# Patient Record
Sex: Female | Born: 1975 | Race: Black or African American | Hispanic: No | Marital: Single | Smoking: Current every day smoker
Health system: Southern US, Community
[De-identification: ages and names within clinical notes are randomized; demographics above are authoritative.]

## PROBLEM LIST (undated history)

## (undated) DIAGNOSIS — B977 Papillomavirus as the cause of diseases classified elsewhere: Secondary | ICD-10-CM

## (undated) DIAGNOSIS — N63 Unspecified lump in unspecified breast: Secondary | ICD-10-CM

## (undated) DIAGNOSIS — R569 Unspecified convulsions: Secondary | ICD-10-CM

## (undated) DIAGNOSIS — R011 Cardiac murmur, unspecified: Secondary | ICD-10-CM

## (undated) DIAGNOSIS — R51 Headache: Secondary | ICD-10-CM

## (undated) DIAGNOSIS — D649 Anemia, unspecified: Secondary | ICD-10-CM

## (undated) HISTORY — DX: Cardiac murmur, unspecified: R01.1

## (undated) HISTORY — PX: OTHER SURGICAL HISTORY: SHX169

## (undated) HISTORY — DX: Anemia, unspecified: D64.9

## (undated) HISTORY — DX: Unspecified lump in unspecified breast: N63.0

---

## 1997-08-09 HISTORY — PX: INGUINAL HERNIA REPAIR: SUR1180

## 1997-09-27 ENCOUNTER — Inpatient Hospital Stay (HOSPITAL_COMMUNITY): Admission: AD | Admit: 1997-09-27 | Discharge: 1997-09-27 | Payer: Self-pay | Admitting: Obstetrics

## 1998-04-01 ENCOUNTER — Ambulatory Visit (HOSPITAL_COMMUNITY): Admission: RE | Admit: 1998-04-01 | Discharge: 1998-04-01 | Payer: Self-pay | Admitting: Obstetrics

## 1998-04-01 ENCOUNTER — Other Ambulatory Visit: Admission: RE | Admit: 1998-04-01 | Discharge: 1998-04-01 | Payer: Self-pay | Admitting: Obstetrics

## 1998-04-16 ENCOUNTER — Ambulatory Visit (HOSPITAL_COMMUNITY): Admission: RE | Admit: 1998-04-16 | Discharge: 1998-04-16 | Payer: Self-pay | Admitting: General Surgery

## 1998-04-30 ENCOUNTER — Ambulatory Visit (HOSPITAL_COMMUNITY): Admission: RE | Admit: 1998-04-30 | Discharge: 1998-04-30 | Payer: Self-pay | Admitting: General Surgery

## 1999-05-24 ENCOUNTER — Inpatient Hospital Stay (HOSPITAL_COMMUNITY): Admission: AD | Admit: 1999-05-24 | Discharge: 1999-05-24 | Payer: Self-pay | Admitting: Obstetrics

## 1999-05-27 ENCOUNTER — Other Ambulatory Visit: Admission: RE | Admit: 1999-05-27 | Discharge: 1999-05-27 | Payer: Self-pay | Admitting: Obstetrics

## 1999-06-10 ENCOUNTER — Emergency Department (HOSPITAL_COMMUNITY): Admission: EM | Admit: 1999-06-10 | Discharge: 1999-06-10 | Payer: Self-pay | Admitting: Emergency Medicine

## 1999-06-10 ENCOUNTER — Encounter: Payer: Self-pay | Admitting: Emergency Medicine

## 1999-08-10 ENCOUNTER — Inpatient Hospital Stay (HOSPITAL_COMMUNITY): Admission: AD | Admit: 1999-08-10 | Discharge: 1999-08-10 | Payer: Self-pay | Admitting: Obstetrics

## 1999-08-17 ENCOUNTER — Inpatient Hospital Stay (HOSPITAL_COMMUNITY): Admission: AD | Admit: 1999-08-17 | Discharge: 1999-08-17 | Payer: Self-pay | Admitting: Obstetrics

## 1999-09-04 ENCOUNTER — Inpatient Hospital Stay (HOSPITAL_COMMUNITY): Admission: AD | Admit: 1999-09-04 | Discharge: 1999-09-04 | Payer: Self-pay | Admitting: Obstetrics

## 1999-09-19 ENCOUNTER — Inpatient Hospital Stay (HOSPITAL_COMMUNITY): Admission: AD | Admit: 1999-09-19 | Discharge: 1999-09-19 | Payer: Self-pay | Admitting: Obstetrics

## 1999-09-21 ENCOUNTER — Other Ambulatory Visit: Admission: RE | Admit: 1999-09-21 | Discharge: 1999-09-21 | Payer: Self-pay | Admitting: Obstetrics

## 1999-12-22 ENCOUNTER — Inpatient Hospital Stay (HOSPITAL_COMMUNITY): Admission: AD | Admit: 1999-12-22 | Discharge: 1999-12-22 | Payer: Self-pay | Admitting: *Deleted

## 1999-12-25 ENCOUNTER — Encounter (HOSPITAL_COMMUNITY): Admission: AD | Admit: 1999-12-25 | Discharge: 2000-01-01 | Payer: Self-pay | Admitting: Obstetrics

## 1999-12-31 ENCOUNTER — Inpatient Hospital Stay (HOSPITAL_COMMUNITY): Admission: AD | Admit: 1999-12-31 | Discharge: 2000-01-03 | Payer: Self-pay | Admitting: Obstetrics

## 2000-05-24 ENCOUNTER — Other Ambulatory Visit: Admission: RE | Admit: 2000-05-24 | Discharge: 2000-05-24 | Payer: Self-pay | Admitting: Obstetrics

## 2000-09-20 ENCOUNTER — Other Ambulatory Visit: Admission: RE | Admit: 2000-09-20 | Discharge: 2000-09-20 | Payer: Self-pay | Admitting: Obstetrics

## 2001-08-07 ENCOUNTER — Inpatient Hospital Stay (HOSPITAL_COMMUNITY): Admission: AD | Admit: 2001-08-07 | Discharge: 2001-08-07 | Payer: Self-pay | Admitting: Obstetrics

## 2001-11-13 ENCOUNTER — Encounter: Admission: RE | Admit: 2001-11-13 | Discharge: 2001-11-13 | Payer: Self-pay | Admitting: General Surgery

## 2001-11-13 ENCOUNTER — Encounter (HOSPITAL_BASED_OUTPATIENT_CLINIC_OR_DEPARTMENT_OTHER): Payer: Self-pay | Admitting: General Surgery

## 2002-04-13 ENCOUNTER — Inpatient Hospital Stay (HOSPITAL_COMMUNITY): Admission: AD | Admit: 2002-04-13 | Discharge: 2002-04-13 | Payer: Self-pay | Admitting: *Deleted

## 2002-11-05 ENCOUNTER — Emergency Department (HOSPITAL_COMMUNITY): Admission: EM | Admit: 2002-11-05 | Discharge: 2002-11-05 | Payer: Self-pay | Admitting: Emergency Medicine

## 2002-11-06 ENCOUNTER — Emergency Department (HOSPITAL_COMMUNITY): Admission: EM | Admit: 2002-11-06 | Discharge: 2002-11-06 | Payer: Self-pay | Admitting: Emergency Medicine

## 2002-11-08 ENCOUNTER — Inpatient Hospital Stay (HOSPITAL_COMMUNITY): Admission: AD | Admit: 2002-11-08 | Discharge: 2002-11-08 | Payer: Self-pay | Admitting: *Deleted

## 2003-04-22 ENCOUNTER — Inpatient Hospital Stay (HOSPITAL_COMMUNITY): Admission: AD | Admit: 2003-04-22 | Discharge: 2003-04-22 | Payer: Self-pay | Admitting: Obstetrics & Gynecology

## 2003-11-01 ENCOUNTER — Inpatient Hospital Stay (HOSPITAL_COMMUNITY): Admission: AD | Admit: 2003-11-01 | Discharge: 2003-11-01 | Payer: Self-pay | Admitting: Obstetrics & Gynecology

## 2004-01-07 ENCOUNTER — Inpatient Hospital Stay (HOSPITAL_COMMUNITY): Admission: AD | Admit: 2004-01-07 | Discharge: 2004-01-07 | Payer: Self-pay | Admitting: Obstetrics

## 2004-02-04 ENCOUNTER — Ambulatory Visit (HOSPITAL_COMMUNITY): Admission: RE | Admit: 2004-02-04 | Discharge: 2004-02-04 | Payer: Self-pay | Admitting: Obstetrics & Gynecology

## 2004-04-06 ENCOUNTER — Inpatient Hospital Stay (HOSPITAL_COMMUNITY): Admission: AD | Admit: 2004-04-06 | Discharge: 2004-04-06 | Payer: Self-pay | Admitting: Obstetrics

## 2004-06-16 ENCOUNTER — Ambulatory Visit (HOSPITAL_COMMUNITY): Admission: RE | Admit: 2004-06-16 | Discharge: 2004-06-16 | Payer: Self-pay | Admitting: Obstetrics & Gynecology

## 2004-06-22 ENCOUNTER — Inpatient Hospital Stay (HOSPITAL_COMMUNITY): Admission: AD | Admit: 2004-06-22 | Discharge: 2004-06-22 | Payer: Self-pay | Admitting: Obstetrics

## 2004-07-08 ENCOUNTER — Observation Stay (HOSPITAL_COMMUNITY): Admission: RE | Admit: 2004-07-08 | Discharge: 2004-07-09 | Payer: Self-pay | Admitting: Obstetrics & Gynecology

## 2004-07-16 ENCOUNTER — Inpatient Hospital Stay (HOSPITAL_COMMUNITY): Admission: AD | Admit: 2004-07-16 | Discharge: 2004-07-18 | Payer: Self-pay | Admitting: Obstetrics

## 2005-01-04 ENCOUNTER — Inpatient Hospital Stay (HOSPITAL_COMMUNITY): Admission: AD | Admit: 2005-01-04 | Discharge: 2005-01-04 | Payer: Self-pay | Admitting: Obstetrics

## 2005-05-08 IMAGING — US US FETAL BPP W/O NONSTRESS
1 series · 18 of 19 positions shown · non-contrast
Comparison: none

CLINICAL DATA: 40 week gestation.  Assess fetal well-being.

[Series 1: us fetal bpp w/o nonstress · non-contrast · 18 of 19 slices shown]
[im 1/19]
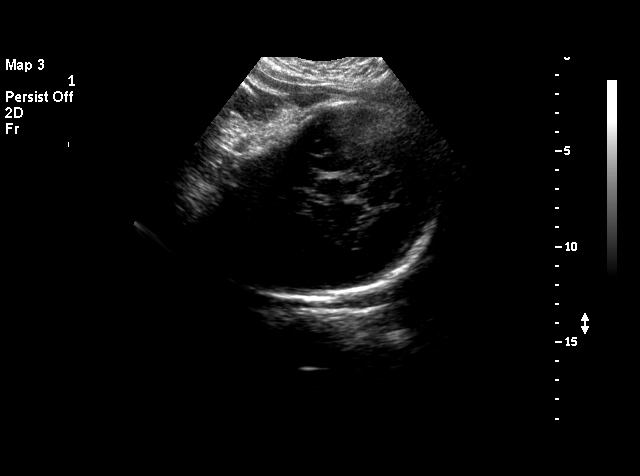
[im 2/19]
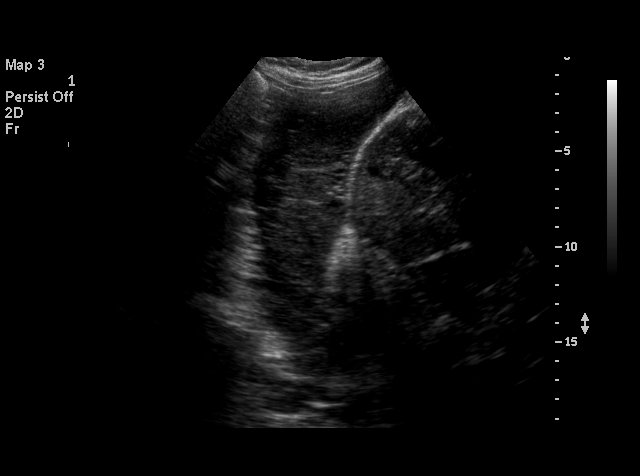
[im 3/19]
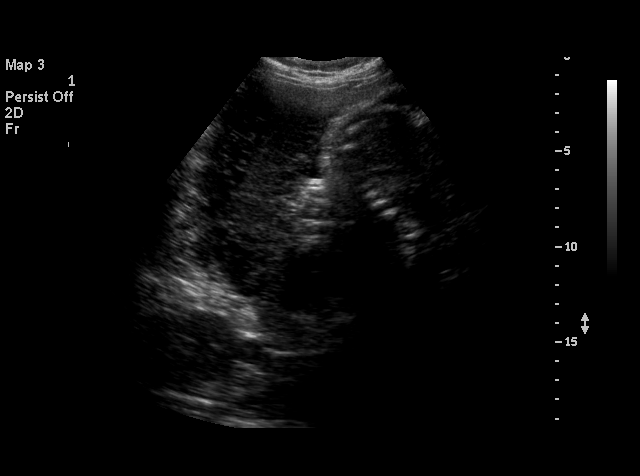
[im 4/19]
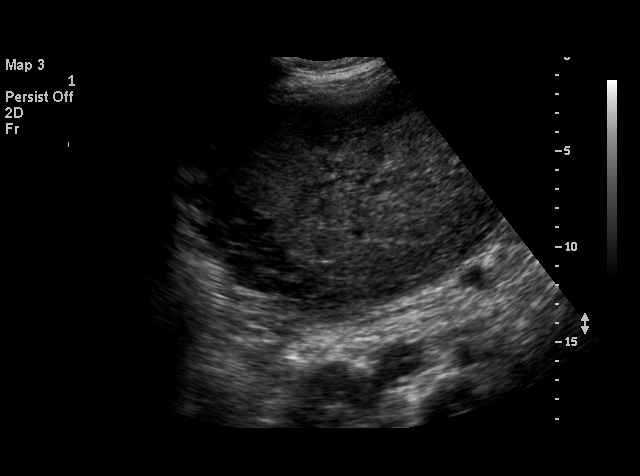
[im 5/19]
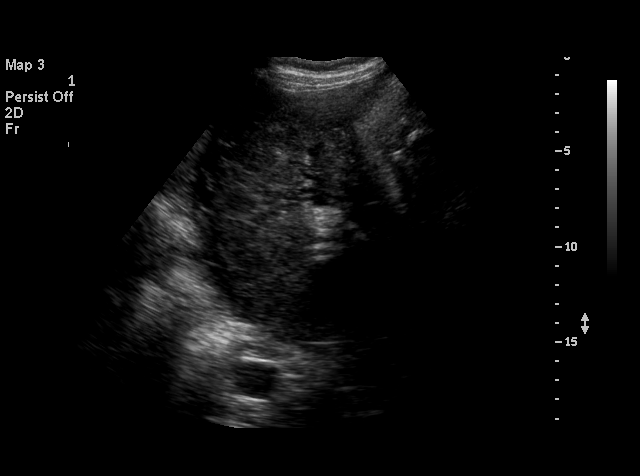
[im 6/19]
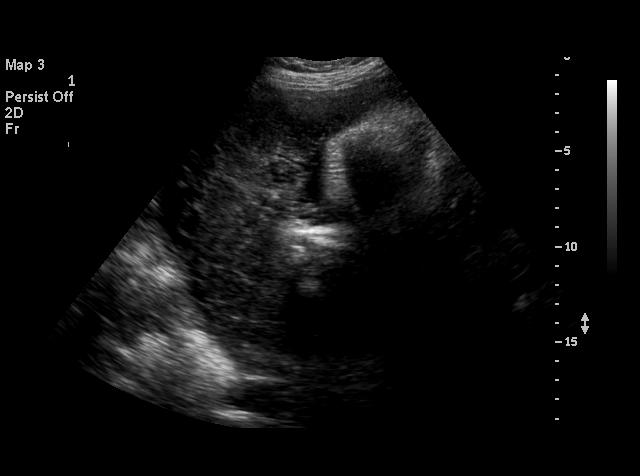
[im 7/19]
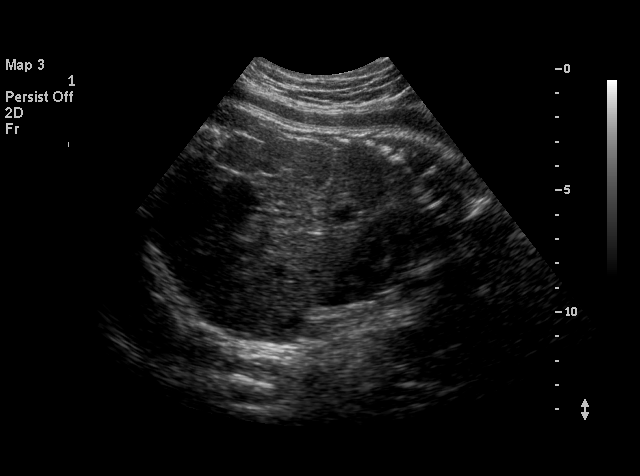
[im 8/19]
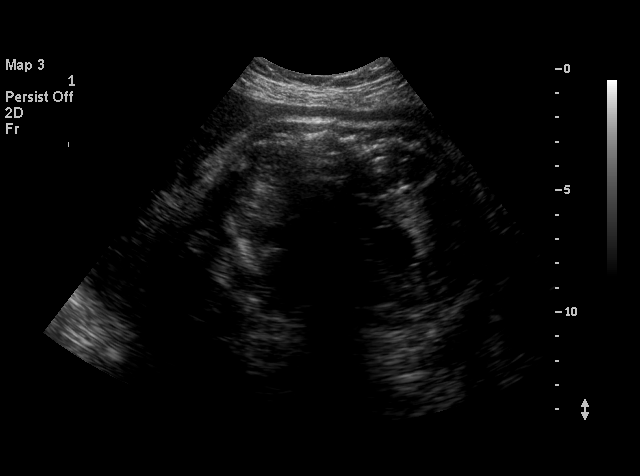
[im 9/19]
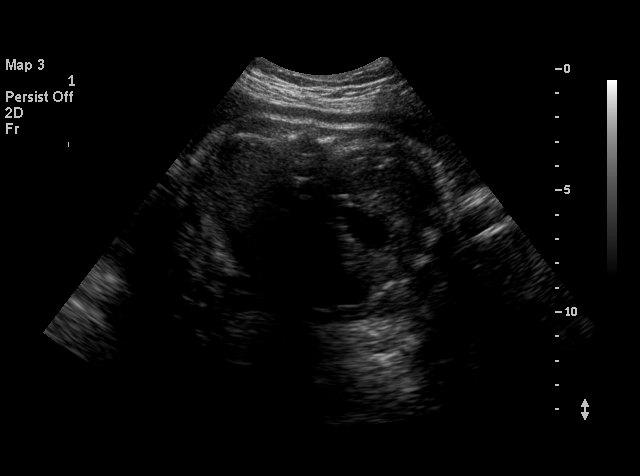
[im 11/19]
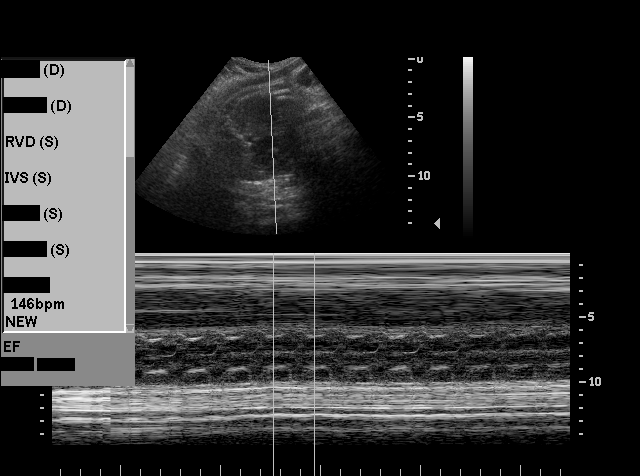
[im 12/19]
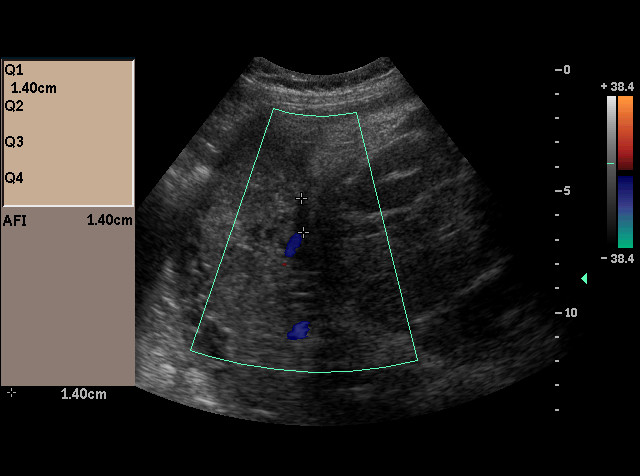
[im 13/19]
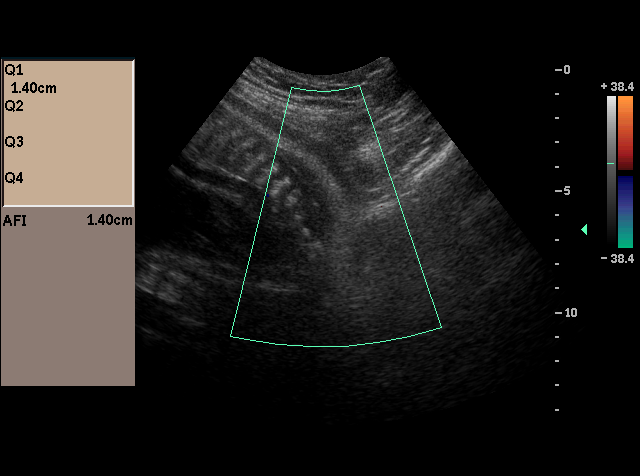
[im 14/19]
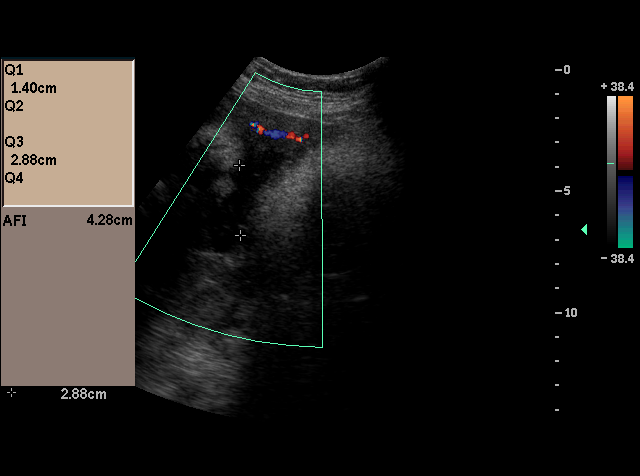
[im 15/19]
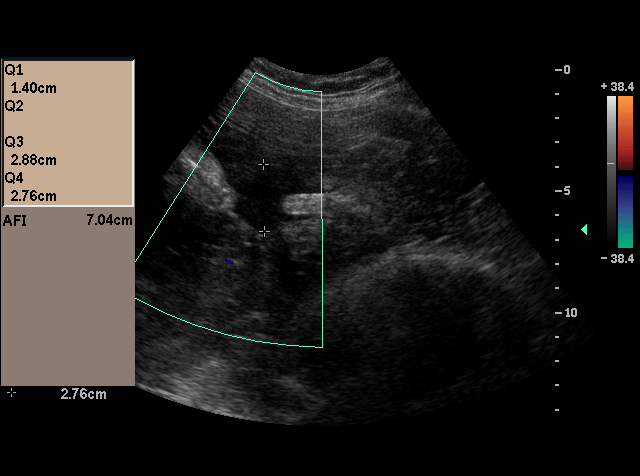
[im 16/19]
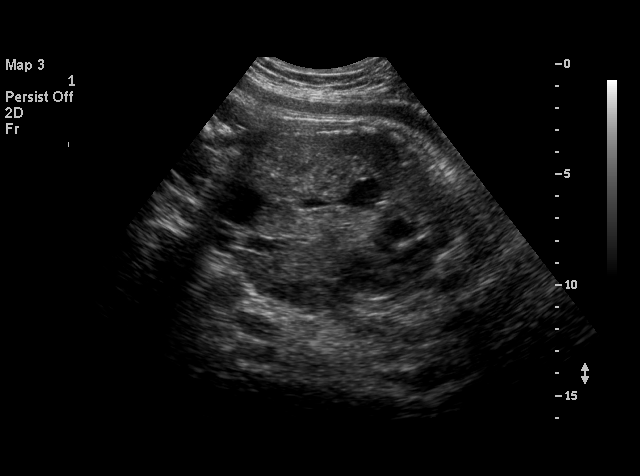
[im 17/19]
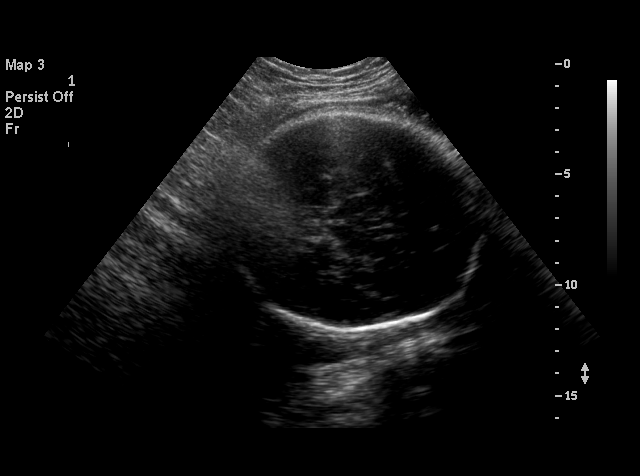
[im 18/19]
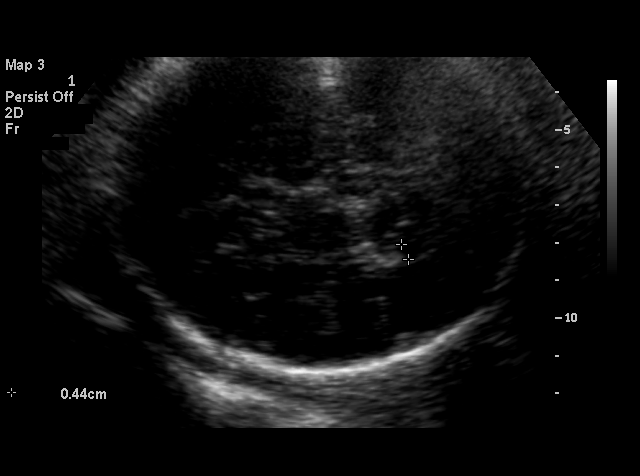
[im 19/19]
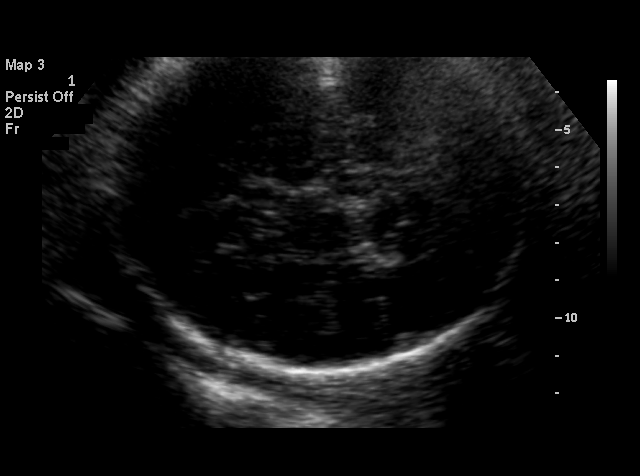

[18 of 19 positions shown; findings below may reference images not displayed]

LIMITED OBSTETRICAL ULTRASOUND:
 Number of Fetuses:  1
 Heart Rate:  146
 Movement:  Yes
 Breathing:  Yes
 Presentation:  Cephalic
 Placental Location:  Fundal, posterior
 Grade:  III
 Previa:  No
 Amniotic Fluid (Subjective):  Low normal to decreased
 Amniotic Fluid (Objective):  7.0 cm AFI (5th -95th%ile = 7.1 ? 21.4 cm for 40 wks)

 Fetal measurements and complete anatomic evaluation were not requested.  The following fetal anatomy was visualized during this exam: Lateral ventricles, thalami, stomach, kidneys and bladder.  

 MATERNAL UTERINE AND ADNEXAL FINDINGS
 Cervix:  Not evaluated.

 BIOPHYSICAL PROFILE

 Movement:  2      Time:  15 minutes
 Breathing:  2
 Tone:  2
 Amniotic Fluid:  2

 Total Score:  8
IMPRESSION: Single living intrauterine fetus in cephalic presentation with subjectively low normal to decreased amniotic fluid volume.  The AFI is 7 cm which falls at the 5th percentile for a 40 week gestation.  
 Assessment of fetal anatomy is limited by the advanced gestational age.
 Biophysical profile score is [DATE] after 15 minutes of observation.

## 2005-05-09 IMAGING — US US OB LIMITED
1 series · 14 of 25 positions shown · non-contrast
Comparison: none

CLINICAL DATA: Low fluid on office ultrasound.

[Series 1: us ob limited · 0.35mm/px · 14 of 25 slices shown]
[im 1/25]
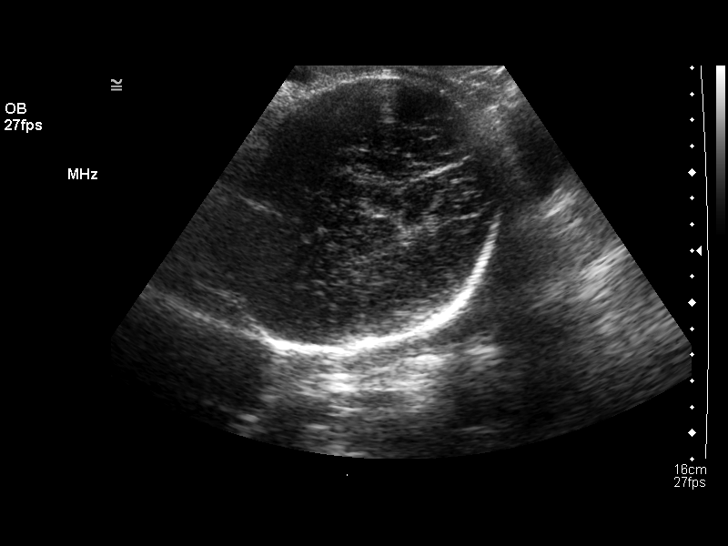
[im 3/25]
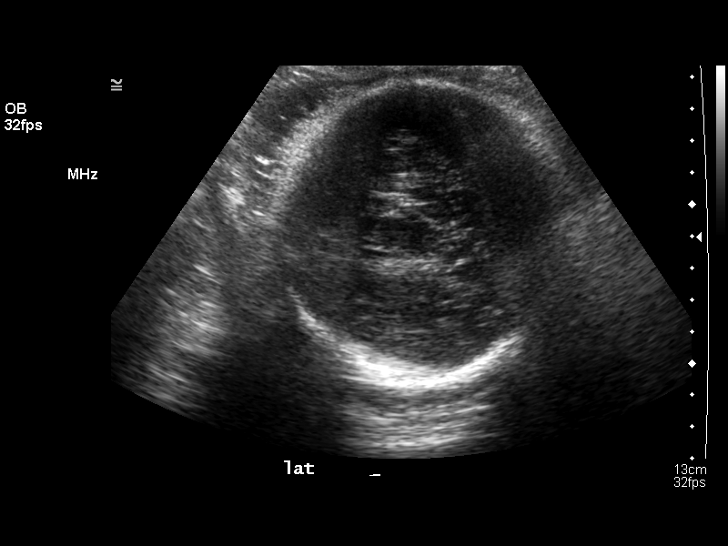
[im 5/25]
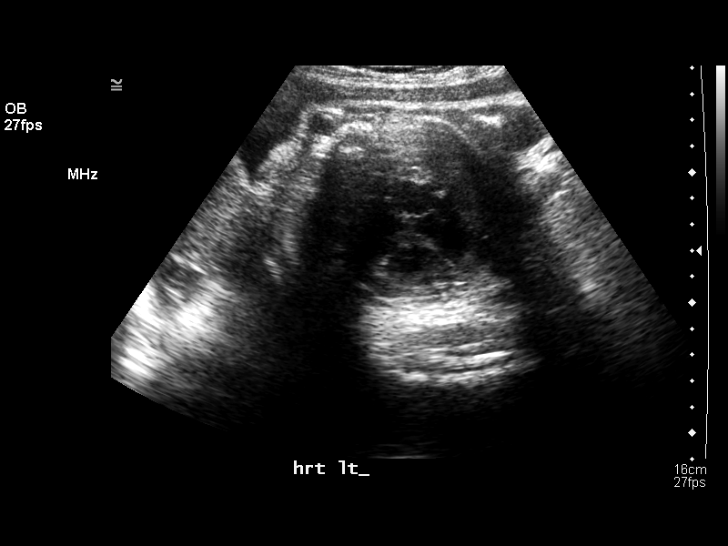
[im 7/25]
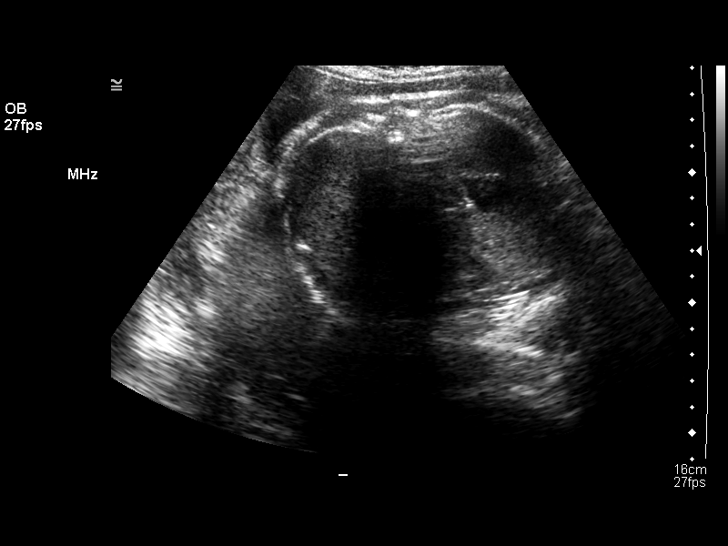
[im 9/25]
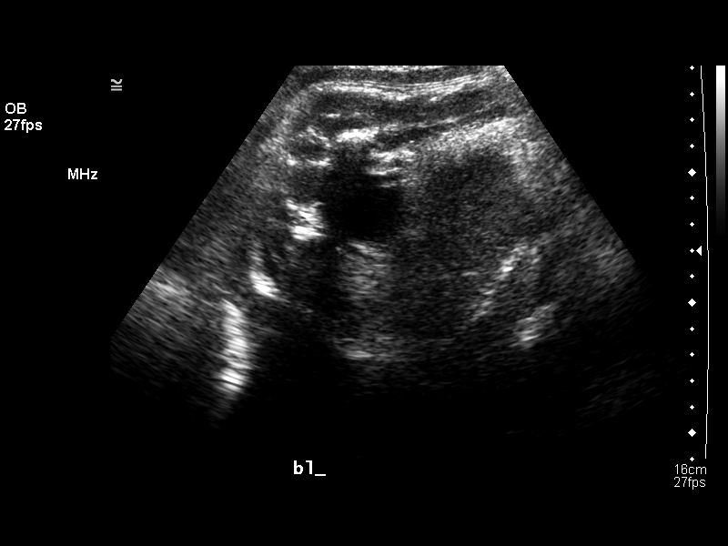
[im 10/25]
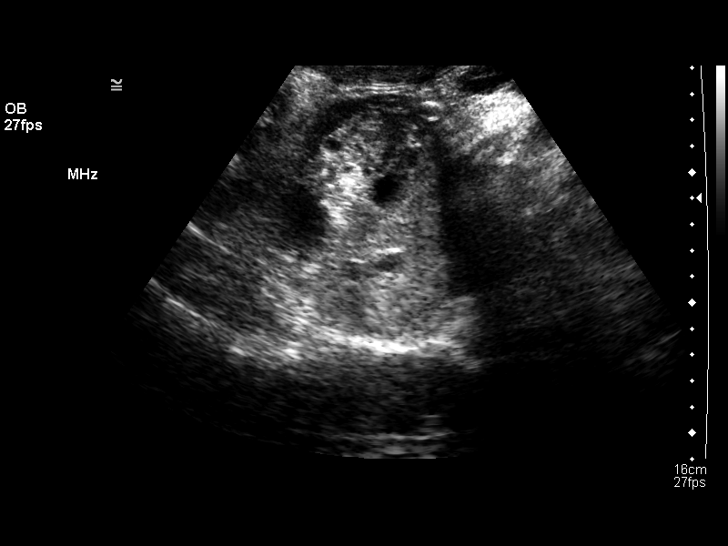
[im 12/25]
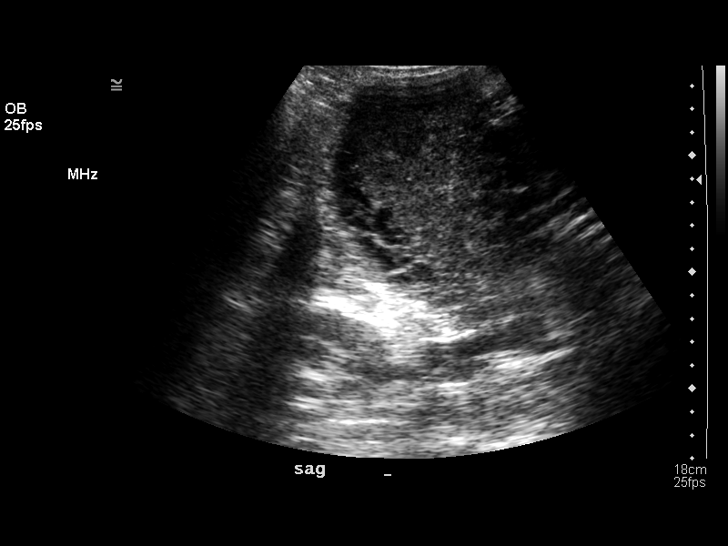
[im 14/25]
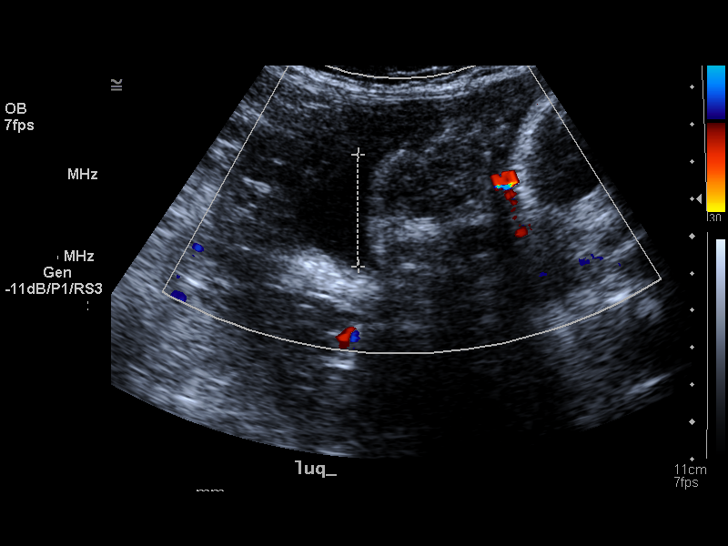
[im 16/25]
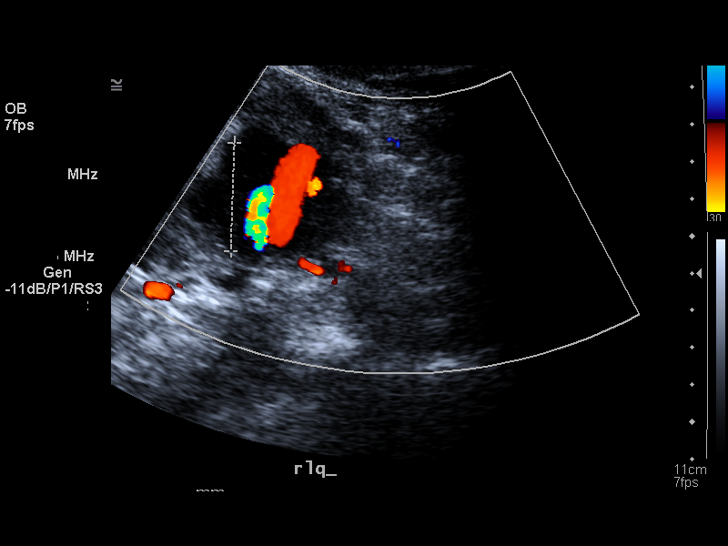
[im 17/25]
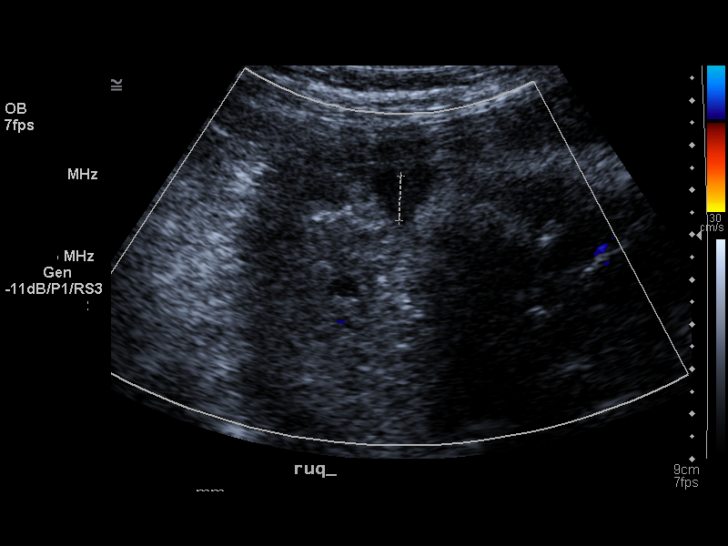
[im 19/25]
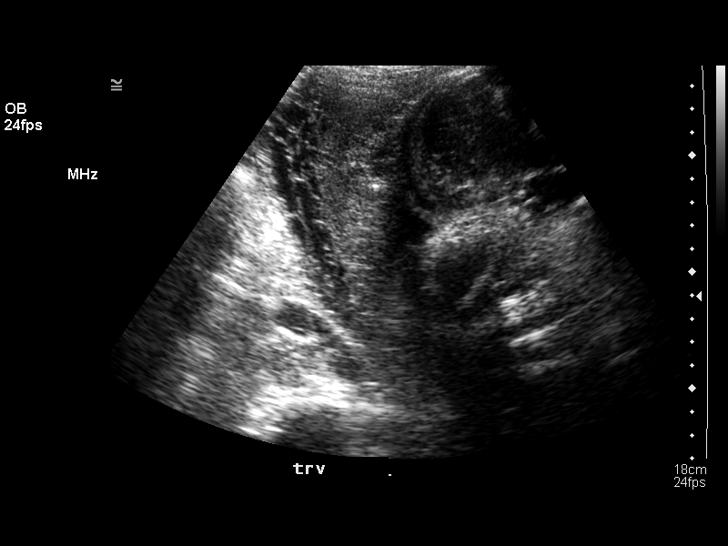
[im 21/25]
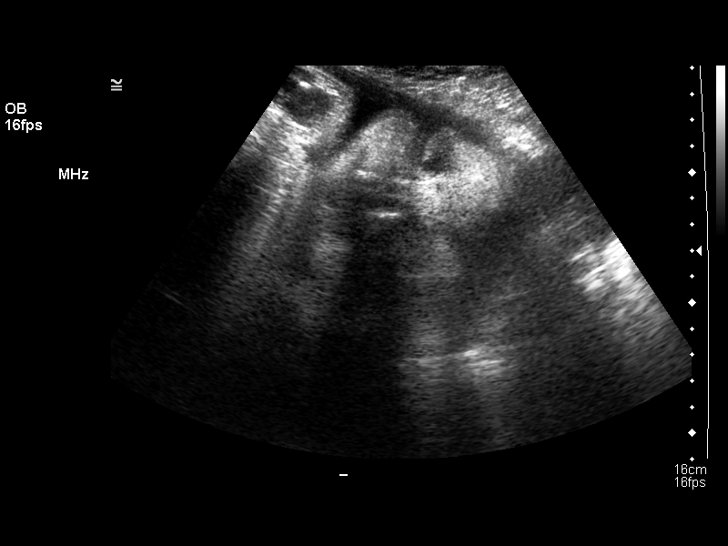
[im 23/25]
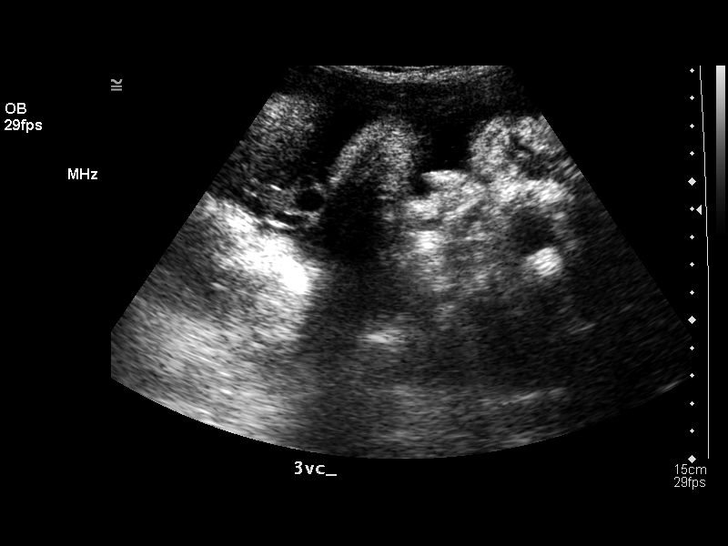
[im 25/25]
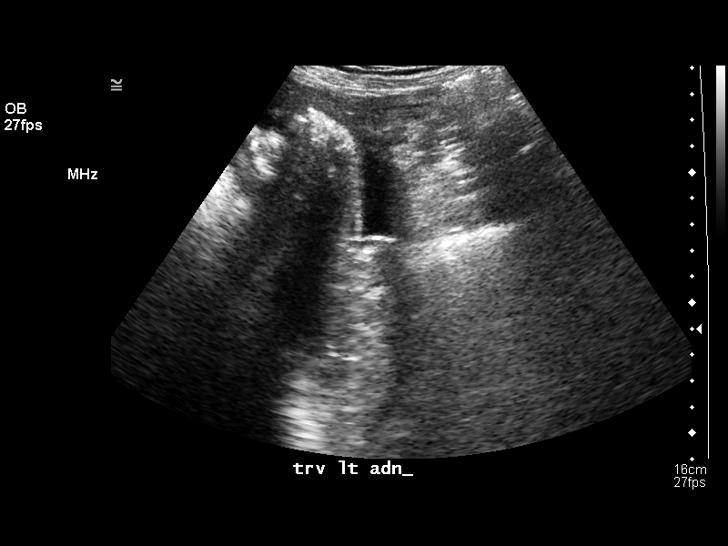

[14 of 25 positions shown; findings below may reference images not displayed]

LIMITED OBSTETRICAL ULTRASOUND:
Number of Fetuses:  1
Heart Rate:  144
Movement:  Yes
Breathing:  Yes
Presentation:  Cephalic
Placental Location:  Fundal, posterior
Grade:  II
Previa:  No
Amniotic Fluid (Subjective):  Normal
Amniotic Fluid (Objective):  11.1 cm AFI (5th -95th%ile = 7.1 ? 21.4 cm for 40 wks)

Fetal measurements and complete anatomic evaluation were not requested.  The following fetal anatomy was visualized during this exam:  Lateral ventricles, stomach, 3-vessel cord, kidneys and bladder.  

MATERNAL UTERINE AND ADNEXAL FINDINGS
Cervix:  Not evaluated
IMPRESSION: 1.  Subjectively and quantitatively normal amniotic fluid volume.
2.  No late developing fetal anatomic abnormalities are identified associated with the lateral ventricles, four  chamber heart, stomach, kidneys or bladder.

## 2005-07-23 ENCOUNTER — Other Ambulatory Visit: Admission: RE | Admit: 2005-07-23 | Discharge: 2005-07-23 | Payer: Self-pay | Admitting: Obstetrics and Gynecology

## 2005-11-03 ENCOUNTER — Ambulatory Visit (HOSPITAL_COMMUNITY): Admission: RE | Admit: 2005-11-03 | Discharge: 2005-11-03 | Payer: Self-pay | Admitting: Obstetrics and Gynecology

## 2005-11-10 ENCOUNTER — Ambulatory Visit (HOSPITAL_COMMUNITY): Admission: RE | Admit: 2005-11-10 | Discharge: 2005-11-10 | Payer: Self-pay | Admitting: Obstetrics and Gynecology

## 2005-11-12 ENCOUNTER — Inpatient Hospital Stay (HOSPITAL_COMMUNITY): Admission: AD | Admit: 2005-11-12 | Discharge: 2005-11-16 | Payer: Self-pay | Admitting: Obstetrics and Gynecology

## 2005-11-13 ENCOUNTER — Encounter (INDEPENDENT_AMBULATORY_CARE_PROVIDER_SITE_OTHER): Payer: Self-pay | Admitting: *Deleted

## 2005-12-08 ENCOUNTER — Encounter (HOSPITAL_BASED_OUTPATIENT_CLINIC_OR_DEPARTMENT_OTHER): Payer: Self-pay | Admitting: General Surgery

## 2006-09-13 IMAGING — US US FETAL BPP W/O NONSTRESS
1 series · 5 of 5 positions shown · non-contrast
Comparison: none

CLINICAL DATA: Repeat BPP.

[Series 1: us fetal bpp w/o nonstress · 0.29mm/px · 5 of 5 slices shown]
[im 1/5]
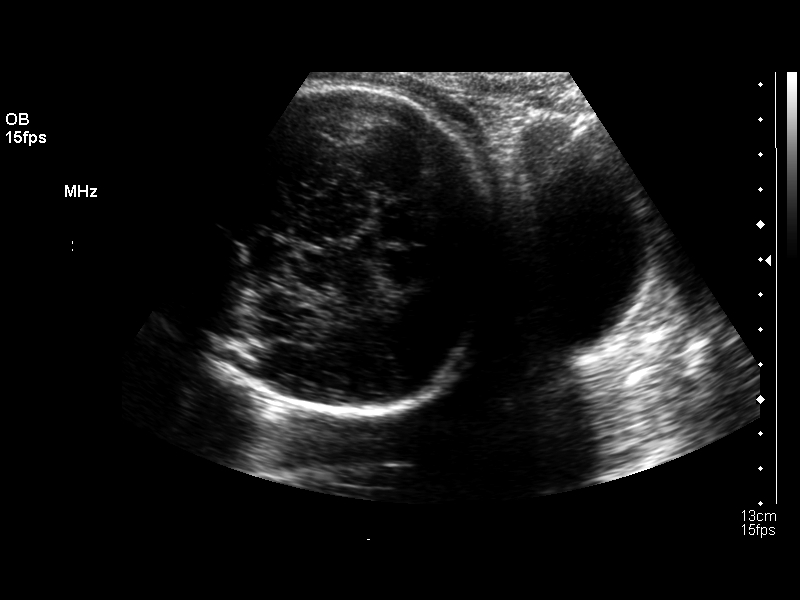
[im 2/5]
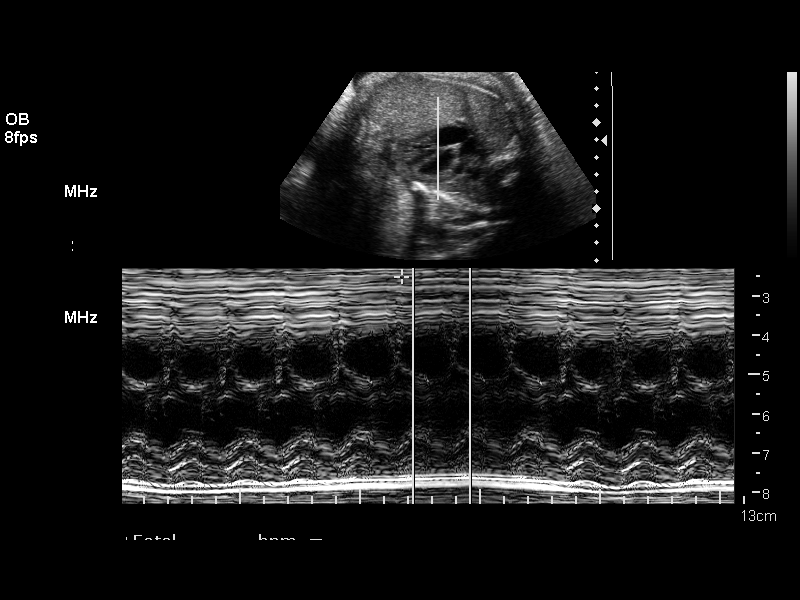
[im 3/5]
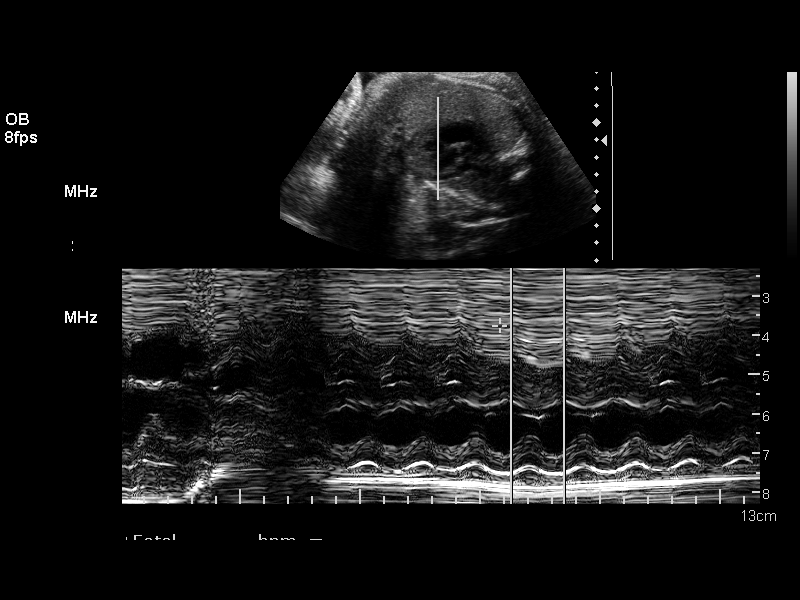
[im 4/5]
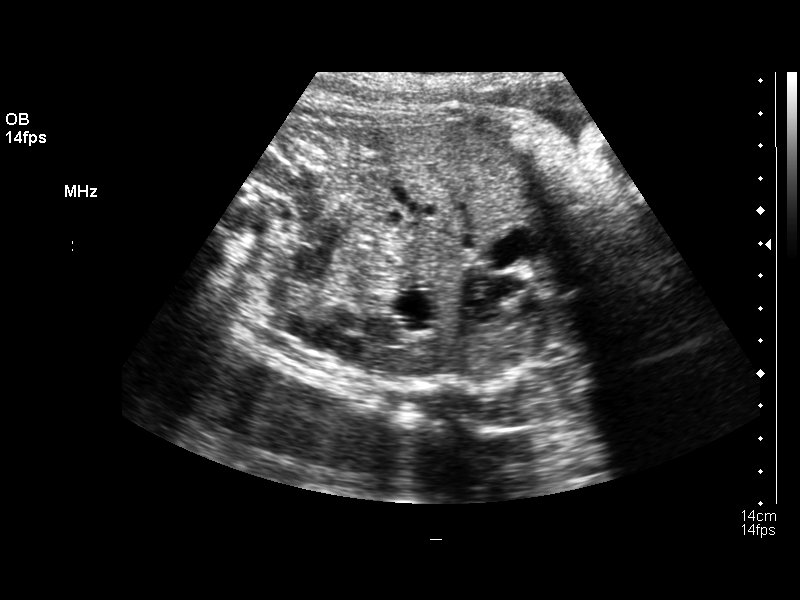
[im 5/5]
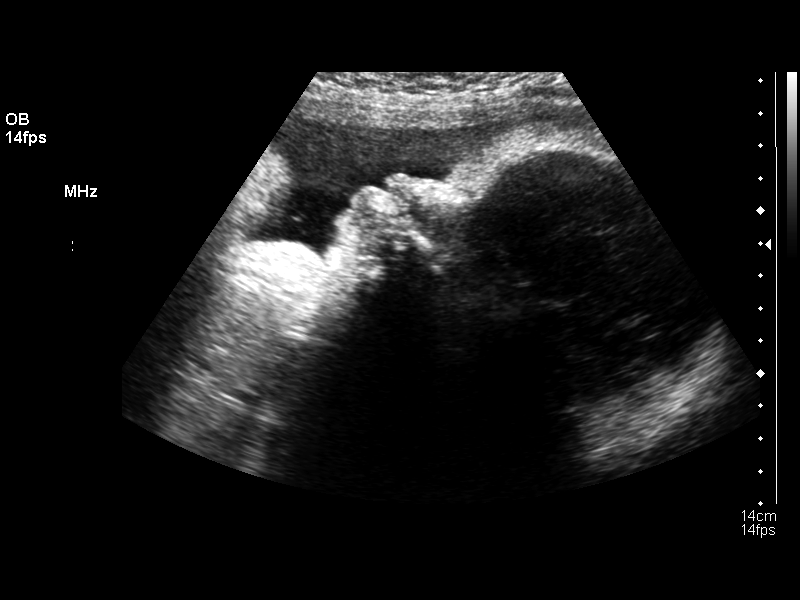

[5 of 5 positions shown; findings below may reference images not displayed]

BIOPHYSICAL PROFILE

 Number of Fetuses:  1
 Heart rate:  136
 Movement: Yes
 Breathing:  No
 Presentation:  Cephalic
 Placental Location:  Posterior, right lateral 
 Previa:  No
 Amniotic Fluid (Subjective):  Normal

 Fetal measurements and complete anatomic evaluation were not requested.  

 BPP SCORING
 Movements:  2  Time:  30 minutes
 Breathing:  0
 Tone:  2
 Amniotic Fluid:  2
 Total Score:  6

 MATERNAL UTERINE AND ADNEXAL FINDINGS
 Cervix:  Not evaluated > 34 wks.
IMPRESSION: 1.  BPP remains [DATE].  No breathing motions detected in 30 minutes.  
 2.  Presentation now cephalic.

## 2009-07-30 ENCOUNTER — Inpatient Hospital Stay (HOSPITAL_COMMUNITY): Admission: AD | Admit: 2009-07-30 | Discharge: 2009-07-30 | Payer: Self-pay | Admitting: Obstetrics and Gynecology

## 2009-10-08 ENCOUNTER — Emergency Department (HOSPITAL_COMMUNITY): Admission: EM | Admit: 2009-10-08 | Discharge: 2009-10-09 | Payer: Self-pay | Admitting: Emergency Medicine

## 2009-10-13 ENCOUNTER — Inpatient Hospital Stay (HOSPITAL_COMMUNITY): Admission: AD | Admit: 2009-10-13 | Discharge: 2009-10-13 | Payer: Self-pay | Admitting: Obstetrics

## 2010-03-21 ENCOUNTER — Emergency Department (HOSPITAL_COMMUNITY): Admission: EM | Admit: 2010-03-21 | Discharge: 2010-03-21 | Payer: Self-pay | Admitting: Emergency Medicine

## 2010-05-30 IMAGING — US US PELVIS COMPLETE MODIFY
1 series · 13 of 25 positions shown · non-contrast
Comparison: None

CLINICAL DATA: Back pain and abdominal pain.  LMP 07/21/2009

TRANSABDOMINAL AND TRANSVAGINAL ULTRASOUND OF PELVIS
TECHNIQUE: Both transabdominal and transvaginal ultrasound
examinations of the pelvis were performed including evaluation of
the uterus, ovaries, adnexal regions, and pelvic cul-de-sac.

[Series 1: us transvaginal non-ob · 13 of 65 slices shown]
[im 1/65]
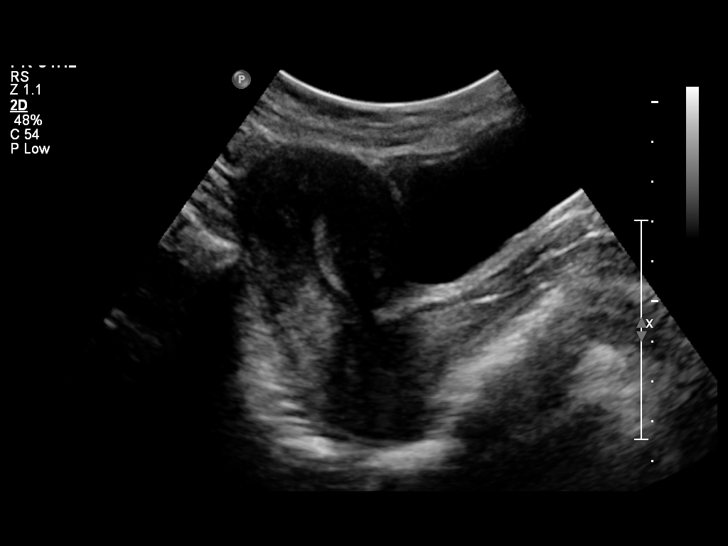
[im 6/65]
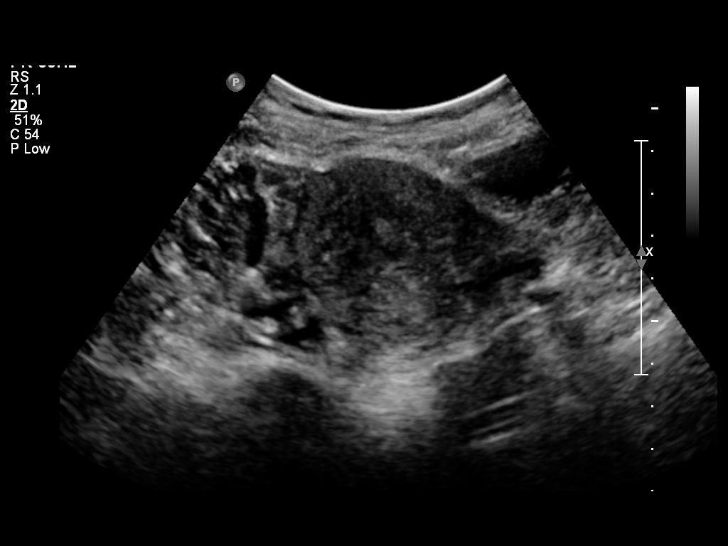
[im 11/65]
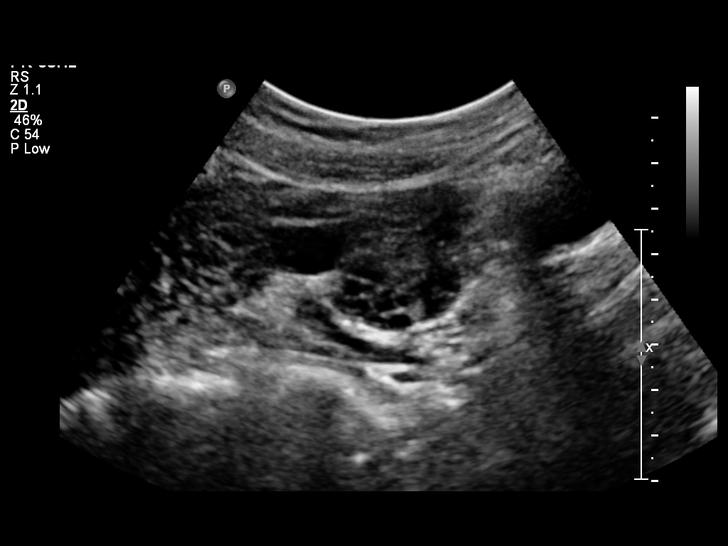
[im 17/65]
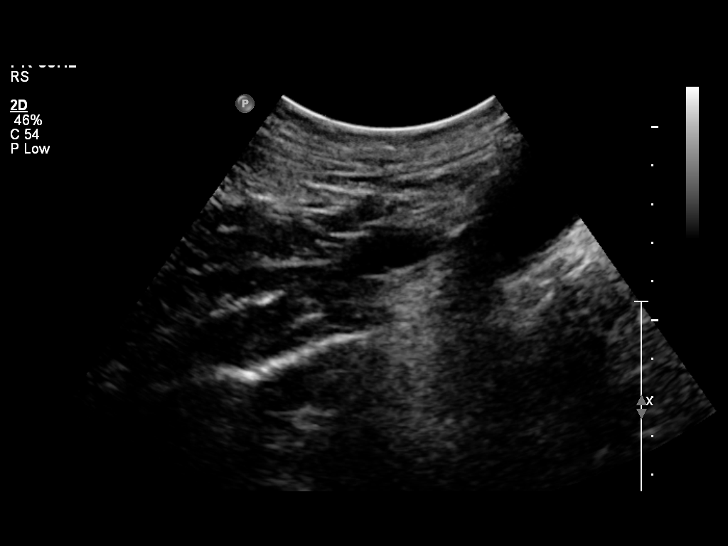
[im 22/65]
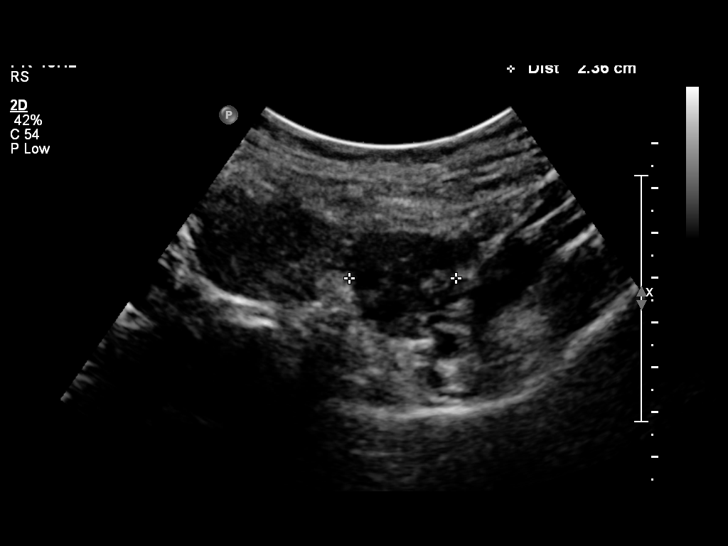
[im 27/65]
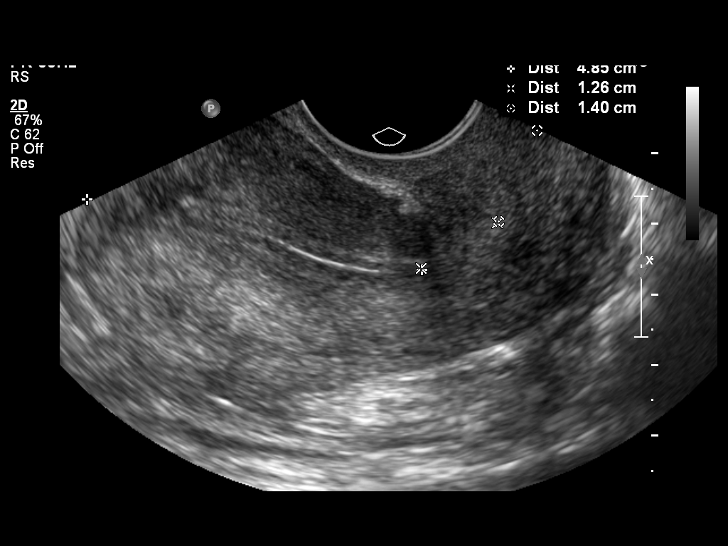
[im 33/65]
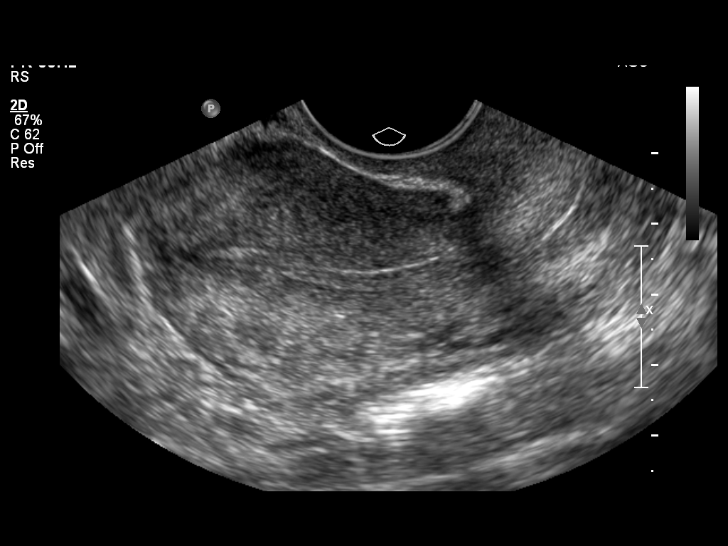
[im 38/65]
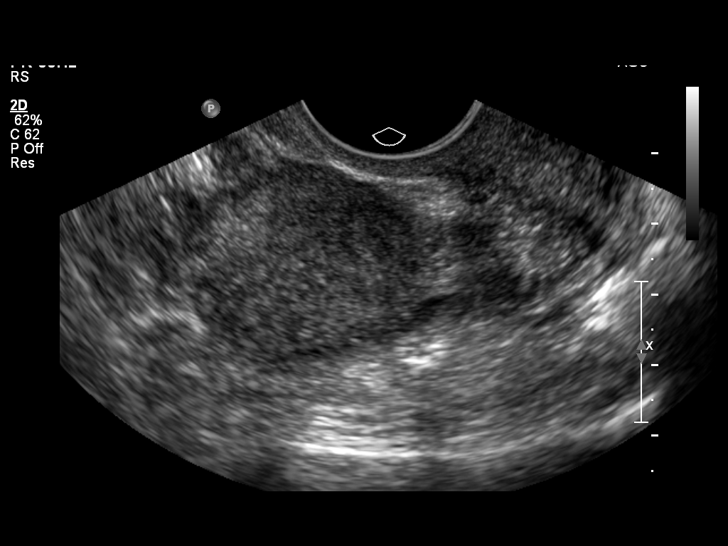
[im 43/65]
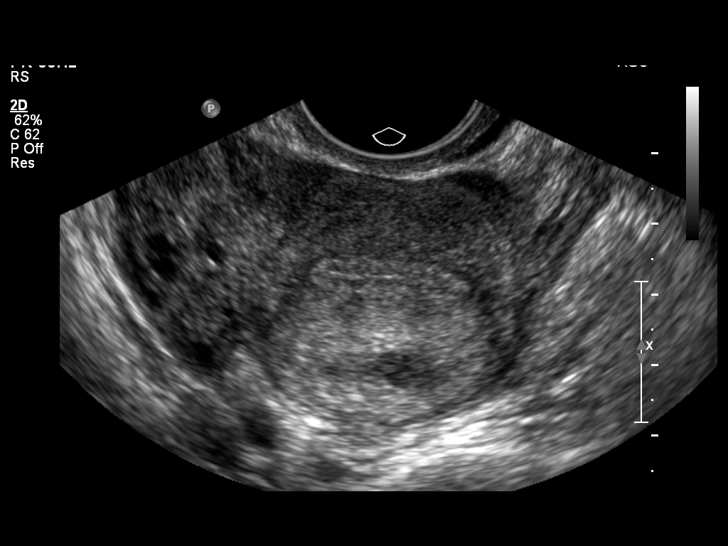
[im 49/65]
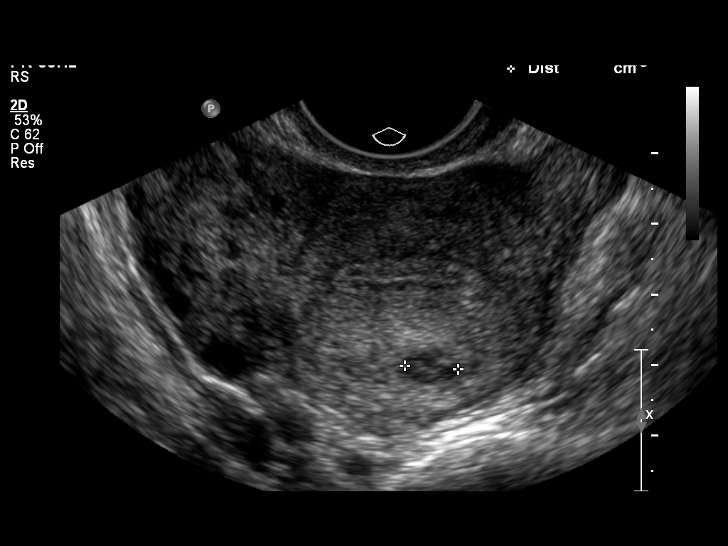
[im 54/65]
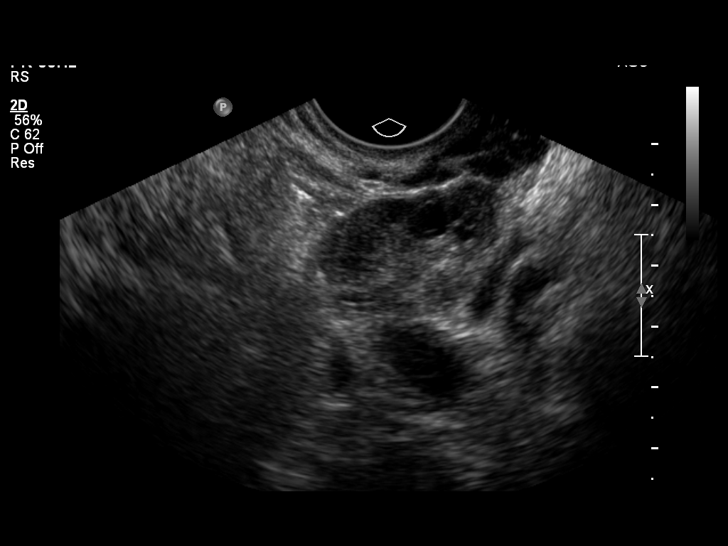
[im 59/65]
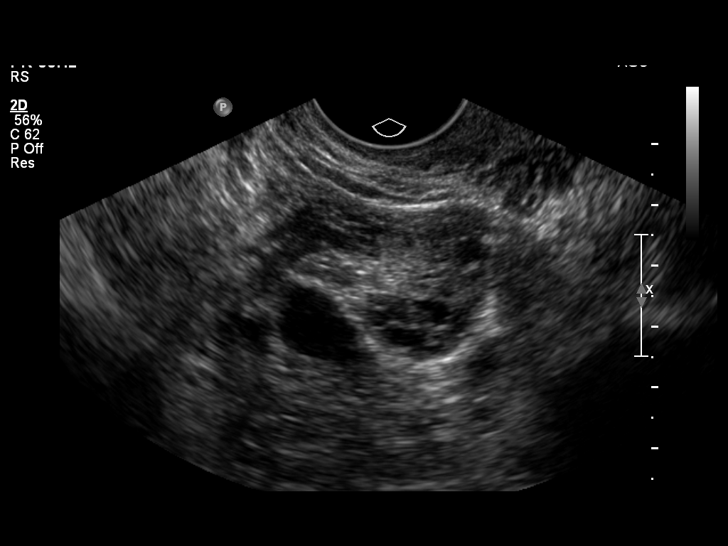
[im 65/65]
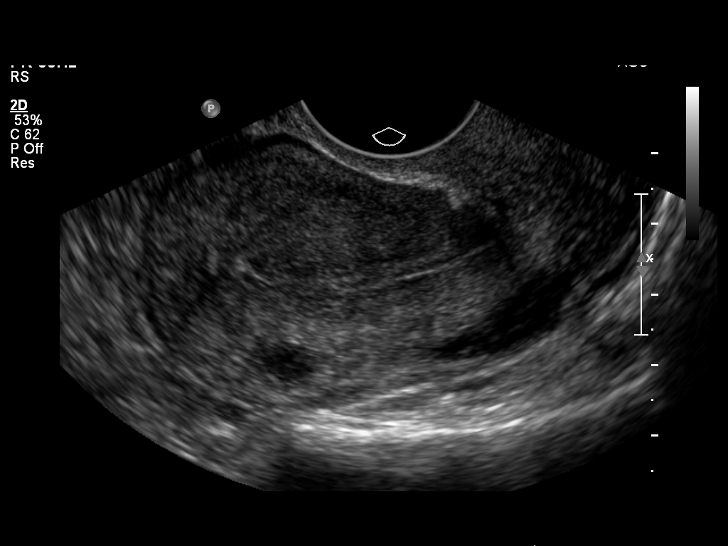

[13 of 25 positions shown; findings below may reference images not displayed]

FINDINGS: Uterus demonstrates a sagittal length of 7.5 cm, AP width of 4.1 cm
and a transverse width of 4.2 cm.  A small focal fibroid is
identified in the left posterolateral upper uterine segment mural
in location measuring 0.8 x 1.0 x 0.5 cm

Endometrium is tri-layered with an AP width of 4.8 mm.  No areas of
focal thickening or inhomogeneity are noted and this would
correlate with the expected periovulatory endometrium given the
patient's LMP of 07/21/2009

Right Ovary the right ovary measures 1.8 x 2.5 x 2.3 cm and has a
normal appearance.

Left Ovary the left ovary measures 3.3 x 1.5 x 2.3 cm and has a
normal appearance, containing a dominant follicle

Other Findings:  No pelvic fluid or separate adnexal masses are
noted.
IMPRESSION: One small focal mural fibroid with sizes location as noted above.
Normal periovulatory endometrial stripe and ovaries.

## 2010-10-23 LAB — CBC
HCT: 36.3 % (ref 36.0–46.0)
Hemoglobin: 11.7 g/dL — ABNORMAL LOW (ref 12.0–15.0)
MCH: 25.2 pg — ABNORMAL LOW (ref 26.0–34.0)
MCHC: 32.3 g/dL (ref 30.0–36.0)
MCV: 78.1 fL (ref 78.0–100.0)
Platelets: 244 10*3/uL (ref 150–400)
RBC: 4.64 MIL/uL (ref 3.87–5.11)
RDW: 13.9 % (ref 11.5–15.5)
WBC: 6.7 10*3/uL (ref 4.0–10.5)

## 2010-10-23 LAB — POCT I-STAT, CHEM 8
BUN: 7 mg/dL (ref 6–23)
Calcium, Ion: 1.27 mmol/L (ref 1.12–1.32)
Chloride: 105 mEq/L (ref 96–112)
Creatinine, Ser: 0.7 mg/dL (ref 0.4–1.2)
Glucose, Bld: 86 mg/dL (ref 70–99)
HCT: 40 % (ref 36.0–46.0)
Hemoglobin: 13.6 g/dL (ref 12.0–15.0)
Potassium: 3.7 mEq/L (ref 3.5–5.1)
Sodium: 139 mEq/L (ref 135–145)
TCO2: 24 mmol/L (ref 0–100)

## 2010-10-23 LAB — DIFFERENTIAL
Basophils Absolute: 0 10*3/uL (ref 0.0–0.1)
Basophils Relative: 0 % (ref 0–1)
Eosinophils Absolute: 0 10*3/uL (ref 0.0–0.7)
Eosinophils Relative: 0 % (ref 0–5)
Lymphocytes Relative: 40 % (ref 12–46)
Lymphs Abs: 2.7 10*3/uL (ref 0.7–4.0)
Monocytes Absolute: 0.4 10*3/uL (ref 0.1–1.0)
Monocytes Relative: 6 % (ref 3–12)
Neutro Abs: 3.6 10*3/uL (ref 1.7–7.7)
Neutrophils Relative %: 54 % (ref 43–77)

## 2010-10-23 LAB — POCT PREGNANCY, URINE: Preg Test, Ur: NEGATIVE

## 2010-10-23 LAB — URINALYSIS, ROUTINE W REFLEX MICROSCOPIC
Bilirubin Urine: NEGATIVE
Glucose, UA: NEGATIVE mg/dL
Hgb urine dipstick: NEGATIVE
Ketones, ur: NEGATIVE mg/dL
Nitrite: NEGATIVE
Protein, ur: NEGATIVE mg/dL
Specific Gravity, Urine: 1.005 (ref 1.005–1.030)
Urobilinogen, UA: 1 mg/dL (ref 0.0–1.0)
pH: 6.5 (ref 5.0–8.0)

## 2010-10-23 LAB — PREGNANCY, URINE: Preg Test, Ur: NEGATIVE

## 2010-11-02 LAB — COMPREHENSIVE METABOLIC PANEL
ALT: 37 U/L — ABNORMAL HIGH (ref 0–35)
AST: 28 U/L (ref 0–37)
Albumin: 4 g/dL (ref 3.5–5.2)
Alkaline Phosphatase: 80 U/L (ref 39–117)
BUN: 10 mg/dL (ref 6–23)
CO2: 24 mEq/L (ref 19–32)
Calcium: 9.7 mg/dL (ref 8.4–10.5)
Chloride: 105 mEq/L (ref 96–112)
Creatinine, Ser: 0.72 mg/dL (ref 0.4–1.2)
GFR calc Af Amer: >60 mL/min (ref >60)
GFR calc non Af Amer: >60 mL/min (ref >60)
Glucose, Bld: 103 mg/dL — ABNORMAL HIGH (ref 70–99)
Potassium: 4.1 mEq/L (ref 3.5–5.1)
Sodium: 136 mEq/L (ref 135–145)
Total Bilirubin: 0.3 mg/dL (ref 0.3–1.2)
Total Protein: 7.7 g/dL (ref 6.0–8.3)

## 2010-11-02 LAB — DIFFERENTIAL
Basophils Absolute: 0 10*3/uL (ref 0.0–0.1)
Basophils Relative: 0 % (ref 0–1)
Eosinophils Absolute: 0.1 10*3/uL (ref 0.0–0.7)
Eosinophils Relative: 1 % (ref 0–5)
Lymphocytes Relative: 52 % — ABNORMAL HIGH (ref 12–46)
Lymphs Abs: 3.8 10*3/uL (ref 0.7–4.0)
Monocytes Absolute: 0.4 10*3/uL (ref 0.1–1.0)
Monocytes Relative: 6 % (ref 3–12)
Neutro Abs: 2.9 10*3/uL (ref 1.7–7.7)
Neutrophils Relative %: 40 % — ABNORMAL LOW (ref 43–77)

## 2010-11-02 LAB — CBC
HCT: 35.5 % — ABNORMAL LOW (ref 36.0–46.0)
Hemoglobin: 11.3 g/dL — ABNORMAL LOW (ref 12.0–15.0)
MCHC: 31.8 g/dL (ref 30.0–36.0)
MCV: 77.6 fL — ABNORMAL LOW (ref 78.0–100.0)
Platelets: 301 10*3/uL (ref 150–400)
RBC: 4.57 MIL/uL (ref 3.87–5.11)
RDW: 13.4 % (ref 11.5–15.5)
WBC: 7.2 10*3/uL (ref 4.0–10.5)

## 2010-11-02 LAB — WET PREP, GENITAL
Clue Cells Wet Prep HPF POC: NONE SEEN
Trich, Wet Prep: NONE SEEN
Yeast Wet Prep HPF POC: NONE SEEN

## 2010-11-02 LAB — URINALYSIS, ROUTINE W REFLEX MICROSCOPIC
Bilirubin Urine: NEGATIVE
Glucose, UA: NEGATIVE mg/dL
Hgb urine dipstick: NEGATIVE
Ketones, ur: NEGATIVE mg/dL
Nitrite: NEGATIVE
Protein, ur: NEGATIVE mg/dL
Specific Gravity, Urine: <1.005 — ABNORMAL LOW (ref 1.005–1.030)
Urobilinogen, UA: 0.2 mg/dL (ref 0.0–1.0)
pH: 6 (ref 5.0–8.0)

## 2010-11-02 LAB — GC/CHLAMYDIA PROBE AMP, GENITAL
Chlamydia, DNA Probe: NEGATIVE
GC Probe Amp, Genital: NEGATIVE

## 2010-11-02 LAB — POCT PREGNANCY, URINE: Preg Test, Ur: NEGATIVE

## 2010-11-02 LAB — RPR: RPR Ser Ql: NONREACTIVE

## 2010-11-09 LAB — WET PREP, GENITAL
Trich, Wet Prep: NONE SEEN
Yeast Wet Prep HPF POC: NONE SEEN

## 2010-11-09 LAB — GC/CHLAMYDIA PROBE AMP, GENITAL: Chlamydia, DNA Probe: NEGATIVE

## 2010-11-09 LAB — URINALYSIS, ROUTINE W REFLEX MICROSCOPIC
Bilirubin Urine: NEGATIVE
Glucose, UA: NEGATIVE mg/dL
Hgb urine dipstick: NEGATIVE
Ketones, ur: NEGATIVE mg/dL
Nitrite: NEGATIVE
Protein, ur: NEGATIVE mg/dL
Specific Gravity, Urine: 1.01 (ref 1.005–1.030)
Urobilinogen, UA: 0.2 mg/dL (ref 0.0–1.0)
pH: 7 (ref 5.0–8.0)

## 2010-11-09 LAB — POCT PREGNANCY, URINE: Preg Test, Ur: NEGATIVE

## 2010-12-10 ENCOUNTER — Inpatient Hospital Stay (HOSPITAL_COMMUNITY)
Admission: AD | Admit: 2010-12-10 | Discharge: 2010-12-10 | Disposition: A | Discharge status: Home / Self Care | Payer: Medicaid Other | Source: Ambulatory Visit | Attending: Obstetrics | Admitting: Obstetrics

## 2010-12-10 DIAGNOSIS — B373 Candidiasis of vulva and vagina: Secondary | ICD-10-CM

## 2010-12-10 DIAGNOSIS — B3731 Acute candidiasis of vulva and vagina: Secondary | ICD-10-CM | POA: Insufficient documentation

## 2010-12-10 DIAGNOSIS — B9689 Other specified bacterial agents as the cause of diseases classified elsewhere: Secondary | ICD-10-CM | POA: Insufficient documentation

## 2010-12-10 DIAGNOSIS — A499 Bacterial infection, unspecified: Secondary | ICD-10-CM

## 2010-12-10 DIAGNOSIS — N760 Acute vaginitis: Secondary | ICD-10-CM

## 2010-12-10 DIAGNOSIS — N949 Unspecified condition associated with female genital organs and menstrual cycle: Secondary | ICD-10-CM

## 2010-12-10 LAB — URINALYSIS, ROUTINE W REFLEX MICROSCOPIC
Bilirubin Urine: NEGATIVE
Nitrite: NEGATIVE
Specific Gravity, Urine: 1.015 (ref 1.005–1.030)
Urobilinogen, UA: 1 mg/dL (ref 0.0–1.0)

## 2010-12-10 LAB — POCT PREGNANCY, URINE: Preg Test, Ur: NEGATIVE

## 2010-12-10 LAB — WET PREP, GENITAL: Trich, Wet Prep: NONE SEEN

## 2010-12-14 LAB — GC/CHLAMYDIA PROBE AMP, GENITAL: Chlamydia, DNA Probe: UNDETERMINED

## 2010-12-25 NOTE — Discharge Summary (Signed)
NAME:  Ashley [name], Ashley [name]          ACCOUNT NO.:  [id number]   MEDICAL RECORD NO.:  [id number]          PATIENT TYPE:  INP   LOCATION:  9122                          FACILITY:  [facility]   PHYSICIAN:  James A. Ashley Royalty, M.D.DATE OF BIRTH:  February 12, 1976   DATE OF ADMISSION:  11/12/2005  DATE OF DISCHARGE:  11/16/2005                                 DISCHARGE SUMMARY   DISCHARGE DIAGNOSES:  1.  Intrauterine pregnancy at 36 weeks and 3 days gestation, delivered.  2.  Nonreassuring fetal surveillance testing.  3.  Previous cesarean section.   OPERATION AND PROCEDURE:  Repeat low transverse cesarean section.   CONSULTATIONS:  None.   DISCHARGE MEDICATIONS:  Percocet and Motrin.   HISTORY AND PHYSICAL:  This is a 35 year old gravida 4, para 3-0-0-3, at 36  weeks and 3 days gestation.  The patient presented to maternity admissions  complaining of a brown discharge and fatigue.  Nonstress test was initially  nonreactive.  Biophysical profile with a score of 6 out of a possible 8.  Cervix was closed, per nurse.   HOSPITAL COURSE:  The patient was observed on maternity admissions unit.  The fetal testing was deemed nonreassuring per Dr. Richardson Dopp.  Owing to the fact  that the patient had a previous cesarean section, the decision was made to  proceed with repeat low transverse cesarean section.  This procedure was  accomplished by Dr. Richardson Dopp on November 13, 2005.  The infant was a 5-pound 11-  ounce female, Apgars 9 at one minute, 10 at five minutes; went to the newborn  nursery.  There were no complications.  The patient's postoperative course  was benign.  She was discharged on the third postoperative day, afebrile and  in satisfactory condition.  Urinalysis was obtained on the day of discharge  and the results are to be called to the office.      James A. Ashley Royalty, M.D.  Electronically Signed     JAM/MEDQ  D:  12/06/2005  T:  12/07/2005  Job:  [id number]

## 2010-12-25 NOTE — Op Note (Signed)
NAMEMARGIE, [name]          ACCOUNT NO.:  [id number]   MEDICAL RECORD NO.:  [id number]          PATIENT TYPE:  OBV   LOCATION:  9122                          FACILITY:  [facility]   PHYSICIAN:  Gerald Leitz, MD          DATE OF BIRTH:  04/28/1976   DATE OF PROCEDURE:  11/13/2005  DATE OF DISCHARGE:                                 OPERATIVE REPORT   PREOPERATIVE DIAGNOSIS:  36-47 weeks intrauterine pregnancy with  nonreassuring fetal testing and history of cesarean section, desires repeat.   POSTOPERATIVE DIAGNOSIS:  36-47 weeks intrauterine pregnancy with  nonreassuring fetal testing and history of cesarean section, desires repeat.   PROCEDURE:  Repeat low transverse cesarean section via Pfannenstiel skin  incision.   SURGEON:  Gerald Leitz, M.D.   ASSISTANT:  None.   ANESTHESIA:  Spinal.   ESTIMATED BLOOD LOSS:  400 mL.   COMPLICATIONS:  None.   FINDINGS:  Female infant in cephalic presentation with Apgars of 9 and 10 at  one and five minutes, respectively.  Cord pH of 7.32.   DESCRIPTION OF PROCEDURE:  The patient was taken to the operating room where  a spinal anesthesia was placed.  She was then prepped and draped in the  usual sterile fashion.  The spinal anesthesia was found to be adequate.  Pfannenstiel skin incision was made with the scalpel and taken down to the  underlying layer of fascia.  The fascia was incised in the midline.  The  incision was extended laterally with Mayo scissors.  The superior aspect of  the fascial incision was grasped with Kocher clamps, elevated, and the  underlying rectus muscles were dissected off with Mayo scissors.  This was  repeated on the inferior aspect of the fascial incision.  The rectus muscles  were identified and they were separated in the midline.  The peritoneum was  identified and entered sharply.  She was found to have omental adhesions to  the anterior abdominal wall.  The peritoneal incision was extended  inferiorly with good  visualization of the bladder.  The bladder blade was  then inserted.  The vesicouterine peritoneum was identified, entered sharply  with Metzenbaum scissors.  The incision was extended laterally and the  bladder flap was created digitally.  The bladder blade was then reinserted.  A low transverse incision was made on the uterus.  The infant's head was  then delivered without difficulty.  The mouth and nose were bulb suctioned  and the body was delivered without difficulty.  The cord was clamped x2 and  cut.  The baby was handed off to the awaiting NICU team.  The placenta was  removed.  The uterus was exteriorized and cleared of all clot and debris.  The uterine incision was repaired with 0 Monocryl in a running locked  fashion.  Excellent hemostasis was noted.  Ovaries appeared normal.  The  uterus was then returned to the abdomen.  The abdomen was irrigated with  normal saline.  Excellent hemostasis was then noted.  Upon inspection of the  fascia, she was noted to have a 1 cm  fascial defect at the anterior aspect  of the fascial incision.  This was repaired with 0 Vicryl.  The fascia was  then reapproximated  with 0 PDS in a running fashion.  The skin was closed with staples.  Needle,  sponge, and instrument counts correct x2.  900 mg of Clindamycin were given  at cord clamp.  The patient tolerated the procedure well and was taken to  the recovery room in stable condition.      Gerald Leitz, MD  Electronically Signed     TC/MEDQ  D:  11/13/2005  T:  11/15/2005  Job:  [id number]

## 2011-01-01 ENCOUNTER — Other Ambulatory Visit (HOSPITAL_COMMUNITY): Payer: Self-pay | Admitting: Obstetrics

## 2011-01-01 DIAGNOSIS — Z1231 Encounter for screening mammogram for malignant neoplasm of breast: Secondary | ICD-10-CM

## 2011-01-21 ENCOUNTER — Ambulatory Visit (HOSPITAL_COMMUNITY): Payer: Medicaid Other

## 2011-01-29 ENCOUNTER — Ambulatory Visit (HOSPITAL_COMMUNITY): Admission: RE | Admit: 2011-01-29 | Payer: Medicaid Other | Source: Ambulatory Visit

## 2011-02-16 ENCOUNTER — Ambulatory Visit (HOSPITAL_COMMUNITY): Admission: RE | Admit: 2011-02-16 | Payer: Medicaid Other | Source: Ambulatory Visit

## 2011-02-17 ENCOUNTER — Ambulatory Visit (HOSPITAL_COMMUNITY): Payer: Medicaid Other

## 2011-02-24 ENCOUNTER — Ambulatory Visit (HOSPITAL_COMMUNITY): Payer: Medicaid Other

## 2011-03-09 ENCOUNTER — Ambulatory Visit (HOSPITAL_COMMUNITY)
Admission: RE | Admit: 2011-03-09 | Discharge: 2011-03-09 | Disposition: A | Discharge status: Home / Self Care | Payer: Medicaid Other | Source: Ambulatory Visit | Attending: Obstetrics | Admitting: Obstetrics

## 2011-03-09 DIAGNOSIS — Z1231 Encounter for screening mammogram for malignant neoplasm of breast: Secondary | ICD-10-CM | POA: Insufficient documentation

## 2011-03-16 ENCOUNTER — Other Ambulatory Visit: Payer: Self-pay | Admitting: Obstetrics

## 2011-03-16 DIAGNOSIS — R928 Other abnormal and inconclusive findings on diagnostic imaging of breast: Secondary | ICD-10-CM

## 2011-03-29 ENCOUNTER — Inpatient Hospital Stay: Admission: RE | Admit: 2011-03-29 | Payer: Medicaid Other | Source: Ambulatory Visit

## 2011-06-01 ENCOUNTER — Ambulatory Visit
Admission: RE | Admit: 2011-06-01 | Discharge: 2011-06-01 | Disposition: A | Discharge status: Home / Self Care | Payer: No Typology Code available for payment source | Source: Ambulatory Visit | Attending: Obstetrics | Admitting: Obstetrics

## 2011-06-01 ENCOUNTER — Encounter: Payer: Self-pay | Admitting: *Deleted

## 2011-06-01 ENCOUNTER — Other Ambulatory Visit: Payer: Self-pay | Admitting: Obstetrics

## 2011-06-01 ENCOUNTER — Encounter (HOSPITAL_COMMUNITY): Payer: Self-pay

## 2011-06-01 ENCOUNTER — Ambulatory Visit (INDEPENDENT_AMBULATORY_CARE_PROVIDER_SITE_OTHER): Payer: Self-pay | Admitting: *Deleted

## 2011-06-01 ENCOUNTER — Ambulatory Visit: Payer: Medicaid Other

## 2011-06-01 VITALS — BP 119/79 | HR 58 | Temp 97.8°F | Ht 62.0 in | Wt 190.0 lb

## 2011-06-01 DIAGNOSIS — N6310 Unspecified lump in the right breast, unspecified quadrant: Secondary | ICD-10-CM

## 2011-06-01 DIAGNOSIS — R928 Other abnormal and inconclusive findings on diagnostic imaging of breast: Secondary | ICD-10-CM

## 2011-06-01 DIAGNOSIS — Z1239 Encounter for other screening for malignant neoplasm of breast: Secondary | ICD-10-CM

## 2011-06-01 DIAGNOSIS — N63 Unspecified lump in unspecified breast: Secondary | ICD-10-CM

## 2011-06-01 NOTE — Patient Instructions (Signed)
Taught patient how to do BSE and gave patient education materials. Gave patient follow up appointment for diagnostic mammogram and ultrasound at the [facility] at 1330 today (10/23). Patients last Pap smear was January 2012. Told patient unless Pap smear was abnormal that she would not need another pap smear for 3 years per BCCCP guidelines. Told patient if would like to have a pap smear completed in 1-2 years that we offer free Pap smear screenings at the [facility]. Patient verbalized understanding and will be at follow up appointment this afternoon.

## 2011-06-01 NOTE — Progress Notes (Signed)
Lump in right breast.  Pap Smear:    Pap smear not performed today due to patient having a Pap smear in January 2012 at Dr. Rhoderick Moody office. Per patient last Pap smear was normal. Pap smear result is not in EPIC. Per BCCCP guidelines will not need another Pap smear until January 2015.  Physical exam: Breasts Breasts symmetrical. No skin abnormalities observed and no nipple retraction bilateral breasts. No nipple discharge bilateral breasts. No lymphadenopathy. No lumps palpated in left breast. Palpated a small lump around 8 o'clock and 9 o'clock in right breast. Patient complained of tenderness on palpation. Patient stated has a history of fibrocystic breasts. Patient is scheduled for a diagnostic mammogram and ultrasound [facility] this afternoon at 1330.     Pelvic/Bimanual   Ext Genitalia No Pap smear completed today due to last pap was January of 2012 less than 1 year ago. Per BCCCP guidelines will not need another Pap smear until January 2015.

## 2011-06-02 ENCOUNTER — Ambulatory Visit
Admission: RE | Admit: 2011-06-02 | Discharge: 2011-06-02 | Disposition: A | Discharge status: Home / Self Care | Payer: No Typology Code available for payment source | Source: Ambulatory Visit | Attending: Obstetrics | Admitting: Obstetrics

## 2011-06-02 DIAGNOSIS — R928 Other abnormal and inconclusive findings on diagnostic imaging of breast: Secondary | ICD-10-CM

## 2011-06-04 ENCOUNTER — Telehealth: Payer: Self-pay | Admitting: *Deleted

## 2011-06-04 NOTE — Telephone Encounter (Signed)
Attempted to call patient to discuss results from breast biopsy and follow up. No one answered phone and voicemail is not set up to leave a message.

## 2011-06-10 DIAGNOSIS — N63 Unspecified lump in unspecified breast: Secondary | ICD-10-CM

## 2011-06-10 HISTORY — DX: Unspecified lump in unspecified breast: N63.0

## 2011-06-15 ENCOUNTER — Other Ambulatory Visit (INDEPENDENT_AMBULATORY_CARE_PROVIDER_SITE_OTHER): Payer: Self-pay | Admitting: Surgery

## 2011-06-15 ENCOUNTER — Encounter (INDEPENDENT_AMBULATORY_CARE_PROVIDER_SITE_OTHER): Payer: Self-pay | Admitting: Surgery

## 2011-06-15 ENCOUNTER — Ambulatory Visit (INDEPENDENT_AMBULATORY_CARE_PROVIDER_SITE_OTHER): Payer: PRIVATE HEALTH INSURANCE | Admitting: Surgery

## 2011-06-15 VITALS — BP 126/84 | HR 60 | Temp 96.8°F | Resp 18 | Ht 62.0 in | Wt 189.5 lb

## 2011-06-15 DIAGNOSIS — N6310 Unspecified lump in the right breast, unspecified quadrant: Secondary | ICD-10-CM

## 2011-06-15 DIAGNOSIS — N63 Unspecified lump in unspecified breast: Secondary | ICD-10-CM

## 2011-06-15 NOTE — Progress Notes (Signed)
Chief Complaint  Patient presents with  . New Evaluation    eval right breast mass    HPI Ashley Walker is a 35 y.o. female.   HPI She is referred by Dr. Gaynell Face after recent mammograms demonstrated masses in the right breast. She has had stereotactic biopsy of both these masses confirming adenomatous. Surgical removal as been recommended. 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 She has no previous history of malignancy. She denies nipple discharge. Past Medical History  Diagnosis Date  . Heart murmur   . Anemia   . Breast mass in female november 2012    two in right breast    Past Surgical History  Procedure Date  . Inguinal hernia repair   . Fibroid cyst in breast   . Cesarean section     2 previous c sections    Family History  Problem Relation Age of Onset  . Hypertension Father   . Hypertension Mother     Social History History  Substance Use Topics  . Smoking status: Former Games developer  . Smokeless tobacco: Never Used  . Alcohol Use: No    Allergies  Allergen Reactions  . Penicillins Hives    No current outpatient prescriptions on file.    Review of Systems Review of Systems  Constitutional: Negative for fever, chills and unexpected weight change.  HENT: Negative for hearing loss, congestion, sore throat, trouble swallowing and voice change.   Eyes: Negative for visual disturbance.  Respiratory: Negative for cough and wheezing.   Cardiovascular: Negative for chest pain, palpitations and leg swelling.  Gastrointestinal: Negative for nausea, vomiting, abdominal pain, diarrhea, constipation, blood in stool, abdominal distention and anal bleeding.  Genitourinary: Negative for hematuria, vaginal bleeding and difficulty urinating.  Musculoskeletal: Negative for arthralgias.  Skin: Negative for rash and wound.  Neurological: Negative for seizures, syncope and headaches.  Hematological: Negative for adenopathy. Does not bruise/bleed  easily.  Psychiatric/Behavioral: Negative for confusion.    Blood pressure 126/84, pulse 60, temperature 96.8 F (36 C), temperature source Temporal, resp. rate 18, height 5\' 2"  (1.575 m), weight 189 lb 8 oz (85.957 kg), last menstrual period 05/15/2011.  Physical Exam Physical Exam  Constitutional: She is oriented to person, place, and time. She appears well-developed and well-nourished. No distress.  HENT:  Head: Normocephalic and atraumatic.  Right Ear: External ear normal.  Left Ear: External ear normal.  Nose: Nose normal.  Mouth/Throat: Oropharynx is clear and moist. No oropharyngeal exudate.  Eyes: Conjunctivae are normal. Pupils are equal, round, and reactive to light. No scleral icterus.  Neck: Normal range of motion. Neck supple. No tracheal deviation present. No thyromegaly present.  Cardiovascular: Normal rate, regular rhythm, normal heart sounds and intact distal pulses.   No murmur heard. Pulmonary/Chest: Effort normal and breath sounds normal. No respiratory distress. She has no wheezes. She has no rales.  Abdominal: Soft. She exhibits no distension. There is no tenderness. There is no rebound.  Musculoskeletal: Normal range of motion. She exhibits no edema and no tenderness.  Lymphadenopathy:    She has no cervical adenopathy.  Neurological: She is alert and oriented to person, place, and time.  Skin: Skin is warm and dry. No rash noted. No erythema.  Psychiatric: Her behavior is normal. Judgment normal.  Breast exam bilaterally. She has a well-healed incision around the right areola. She is to do a biopsy sites. She has a vague fullness at the 12:00 position. I cannot palpate any other masses. There is  shotty bilateral axillary adenopathy  Data Reviewed I have reviewed the patient's mammograms, ultrasound, and biopsy. These revealed 2 adenomas of the right breast  Assessment    Patient with 2 separate right breast masses of uncertain malignant potential     Plan    Needle localized removal of these areas is recommended for histologic evaluation. I discussed this with the patient in detail. I discussed the risk of surgery which includes but not limited to bleeding, infection, injury, need for further surgery should malignancy be present, et Karie Soda. She understands and wishes to proceed. Likelihood of success is good.       Sender Rueb A 06/15/2011, 11:40 AM

## 2011-06-16 ENCOUNTER — Telehealth: Payer: Self-pay | Admitting: *Deleted

## 2011-06-16 NOTE — Telephone Encounter (Signed)
Called patient at 1340 and left voicemail for patient to call me back. Patient called me back a minute later. Discussed with patient that BCCCP will cover breast surgery to remove lump. Told patient if cancer is found then [facility] will cover and will contact her for more information. Patient's surgery is scheduled for 07/23/11. Will follow up with patient after surgery. Patient verbalized understanding.

## 2011-07-07 ENCOUNTER — Encounter (HOSPITAL_COMMUNITY): Payer: Self-pay | Admitting: Pharmacy Technician

## 2011-07-15 ENCOUNTER — Encounter (HOSPITAL_COMMUNITY)
Admission: RE | Admit: 2011-07-15 | Discharge: 2011-07-15 | Disposition: A | Discharge status: Home / Self Care | Payer: Medicaid Other | Source: Ambulatory Visit | Attending: Surgery | Admitting: Surgery

## 2011-07-15 ENCOUNTER — Encounter (HOSPITAL_COMMUNITY): Payer: Self-pay

## 2011-07-15 HISTORY — DX: Headache: R51

## 2011-07-15 HISTORY — DX: Unspecified convulsions: R56.9

## 2011-07-15 LAB — SURGICAL PCR SCREEN: Staphylococcus aureus: NEGATIVE

## 2011-07-15 LAB — CBC
HCT: 35.7 % — ABNORMAL LOW (ref 36.0–46.0)
MCHC: 31.4 g/dL (ref 30.0–36.0)
MCV: 76.6 fL — ABNORMAL LOW (ref 78.0–100.0)
RDW: 13.6 % (ref 11.5–15.5)
WBC: 5.4 10*3/uL (ref 4.0–10.5)

## 2011-07-15 NOTE — Pre-Procedure Instructions (Signed)
20 ABY GESSEL  07/15/2011   Your procedure is scheduled on: Fri,Dec 14 @ 0930 Report to [facility] at 0630 AM.  Call this number if you have problems the morning of surgery: [phone]   Remember:   Do not eat food:After Midnight.  May have clear liquids: up to 4 Hours before arrival.(until 2:30am)  Clear liquids include soda, tea, black coffee, apple or grape juice, broth.  Take these medicines the morning of surgery with A SIP OF WATER:    Do not wear jewelry, make-up or nail polish.  Do not wear lotions, powders, or perfumes. You may wear deodorant.  Do not shave 48 hours prior to surgery.  Do not bring valuables to the hospital.  Contacts, dentures or bridgework may not be worn into surgery.  Leave suitcase in the car. After surgery it may be brought to your room.  For patients admitted to the hospital, checkout time is 11:00 AM the day of discharge.   Patients discharged the day of surgery will not be allowed to drive home.  Name and phone number of your driver:   Special Instructions: CHG Shower Use Special Wash: 1/2 bottle night before surgery and 1/2 bottle morning of surgery.   Please read over the following fact sheets that you were given: Pain Booklet, Coughing and Deep Breathing, MRSA Information and Surgical Site Infection Prevention

## 2011-07-22 MED ORDER — CIPROFLOXACIN IN D5W 400 MG/200ML IV SOLN
400.0000 mg | INTRAVENOUS | Status: AC
  Administered 2011-07-23: 200 mg via INTRAVENOUS
  Filled 2011-07-22: qty 200

## 2011-07-23 ENCOUNTER — Encounter (HOSPITAL_COMMUNITY): Payer: Self-pay | Admitting: Anesthesiology

## 2011-07-23 ENCOUNTER — Ambulatory Visit (HOSPITAL_COMMUNITY): Payer: Medicaid Other | Admitting: Anesthesiology

## 2011-07-23 ENCOUNTER — Encounter (HOSPITAL_COMMUNITY): Payer: Self-pay | Admitting: *Deleted

## 2011-07-23 ENCOUNTER — Encounter (HOSPITAL_COMMUNITY)
Admission: RE | Disposition: A | Discharge status: Home / Self Care | Payer: Self-pay | Source: Ambulatory Visit | Attending: Surgery

## 2011-07-23 ENCOUNTER — Ambulatory Visit
Admit: 2011-07-23 | Discharge: 2011-07-23 | Disposition: A | Discharge status: Home / Self Care | Payer: Self-pay | Attending: Surgery | Admitting: Surgery

## 2011-07-23 ENCOUNTER — Other Ambulatory Visit (INDEPENDENT_AMBULATORY_CARE_PROVIDER_SITE_OTHER): Payer: Self-pay | Admitting: Surgery

## 2011-07-23 ENCOUNTER — Ambulatory Visit
Admission: RE | Admit: 2011-07-23 | Discharge: 2011-07-23 | Disposition: A | Discharge status: Home / Self Care | Payer: Self-pay | Source: Ambulatory Visit | Attending: Surgery | Admitting: Surgery

## 2011-07-23 ENCOUNTER — Ambulatory Visit (HOSPITAL_COMMUNITY)
Admission: RE | Admit: 2011-07-23 | Discharge: 2011-07-23 | Disposition: A | Discharge status: Home / Self Care | Payer: Medicaid Other | Source: Ambulatory Visit | Attending: Surgery | Admitting: Surgery

## 2011-07-23 DIAGNOSIS — N6310 Unspecified lump in the right breast, unspecified quadrant: Secondary | ICD-10-CM

## 2011-07-23 DIAGNOSIS — R569 Unspecified convulsions: Secondary | ICD-10-CM | POA: Insufficient documentation

## 2011-07-23 DIAGNOSIS — R51 Headache: Secondary | ICD-10-CM | POA: Insufficient documentation

## 2011-07-23 DIAGNOSIS — D249 Benign neoplasm of unspecified breast: Secondary | ICD-10-CM | POA: Insufficient documentation

## 2011-07-23 HISTORY — PX: BREAST LUMPECTOMY: SHX2

## 2011-07-23 SURGERY — BREAST LUMPECTOMY
Anesthesia: General | Site: Breast | Laterality: Right | Wound class: Clean

## 2011-07-23 MED ORDER — FENTANYL CITRATE 0.05 MG/ML IJ SOLN
INTRAMUSCULAR | Status: DC | PRN
  Administered 2011-07-23: 125 ug via INTRAVENOUS
  Administered 2011-07-23: 25 ug via INTRAVENOUS
  Administered 2011-07-23: 50 ug via INTRAVENOUS

## 2011-07-23 MED ORDER — PROPOFOL 10 MG/ML IV EMUL
INTRAVENOUS | Status: DC | PRN
  Administered 2011-07-23: 200 mg via INTRAVENOUS

## 2011-07-23 MED ORDER — HYDROMORPHONE HCL PF 1 MG/ML IJ SOLN
0.2500 mg | INTRAMUSCULAR | Status: DC | PRN
  Administered 2011-07-23 (×2): 0.5 mg via INTRAVENOUS

## 2011-07-23 MED ORDER — ONDANSETRON HCL 4 MG/2ML IJ SOLN
INTRAMUSCULAR | Status: DC | PRN
  Administered 2011-07-23: 4 mg via INTRAVENOUS

## 2011-07-23 MED ORDER — LACTATED RINGERS IV SOLN
INTRAVENOUS | Status: DC | PRN
  Administered 2011-07-23: 10:00:00 via INTRAVENOUS

## 2011-07-23 MED ORDER — ONDANSETRON HCL 4 MG/2ML IJ SOLN: 4.0000 mg | Freq: Four times a day (QID) | INTRAMUSCULAR | Status: DC | PRN

## 2011-07-23 MED ORDER — MIDAZOLAM HCL 5 MG/5ML IJ SOLN
INTRAMUSCULAR | Status: DC | PRN
  Administered 2011-07-23: 2 mg via INTRAVENOUS

## 2011-07-23 MED ORDER — 0.9 % SODIUM CHLORIDE (POUR BTL) OPTIME
TOPICAL | Status: DC | PRN
  Administered 2011-07-23: 1000 mL

## 2011-07-23 MED ORDER — DEXAMETHASONE SODIUM PHOSPHATE 4 MG/ML IJ SOLN
INTRAMUSCULAR | Status: DC | PRN
  Administered 2011-07-23: 4 mg via INTRAVENOUS

## 2011-07-23 MED ORDER — LIDOCAINE HCL (CARDIAC) 20 MG/ML IV SOLN
INTRAVENOUS | Status: DC | PRN
  Administered 2011-07-23: 40 mg via INTRAVENOUS

## 2011-07-23 MED ORDER — LACTATED RINGERS IV SOLN
INTRAVENOUS | Status: DC
  Administered 2011-07-23: 11:00:00 via INTRAVENOUS

## 2011-07-23 MED ORDER — PROMETHAZINE HCL 25 MG/ML IJ SOLN: 12.5000 mg | Freq: Four times a day (QID) | INTRAMUSCULAR | Status: DC | PRN

## 2011-07-23 MED ORDER — KETOROLAC TROMETHAMINE 30 MG/ML IJ SOLN
INTRAMUSCULAR | Status: DC | PRN
  Administered 2011-07-23: 30 mg via INTRAVENOUS

## 2011-07-23 MED ORDER — ONDANSETRON HCL 4 MG/2ML IJ SOLN: 4.0000 mg | Freq: Once | INTRAMUSCULAR | Status: DC | PRN

## 2011-07-23 MED ORDER — BUPIVACAINE-EPINEPHRINE 0.25% -1:200000 IJ SOLN
INTRAMUSCULAR | Status: DC | PRN
  Administered 2011-07-23: 20 mL

## 2011-07-23 MED ORDER — HYDROCODONE-ACETAMINOPHEN 5-325 MG PO TABS: 1.0000 | ORAL_TABLET | Freq: Four times a day (QID) | ORAL | Status: AC | PRN

## 2011-07-23 SURGICAL SUPPLY — 47 items
BENZOIN TINCTURE PRP APPL 2/3 (GAUZE/BANDAGES/DRESSINGS) ×2 IMPLANT
BINDER BREAST LRG (GAUZE/BANDAGES/DRESSINGS) IMPLANT
BINDER BREAST XLRG (GAUZE/BANDAGES/DRESSINGS) IMPLANT
BLADE SURG 15 STRL LF DISP TIS (BLADE) ×1 IMPLANT
BLADE SURG 15 STRL SS (BLADE) ×1
CANISTER SUCTION 2500CC (MISCELLANEOUS) ×2 IMPLANT
CHLORAPREP W/TINT 26ML (MISCELLANEOUS) ×2 IMPLANT
CLOTH BEACON ORANGE TIMEOUT ST (SAFETY) ×2 IMPLANT
CONT SPEC 4OZ CLIKSEAL STRL BL (MISCELLANEOUS) IMPLANT
COVER SURGICAL LIGHT HANDLE (MISCELLANEOUS) ×4 IMPLANT
DECANTER SPIKE VIAL GLASS SM (MISCELLANEOUS) ×2 IMPLANT
DERMABOND ADVANCED (GAUZE/BANDAGES/DRESSINGS)
DERMABOND ADVANCED .7 DNX12 (GAUZE/BANDAGES/DRESSINGS) IMPLANT
DEVICE DUBIN SPECIMEN MAMMOGRA (MISCELLANEOUS) ×4 IMPLANT
DRAPE PED LAPAROTOMY (DRAPES) ×2 IMPLANT
DRSG TEGADERM 2.38X2.75 (GAUZE/BANDAGES/DRESSINGS) ×2 IMPLANT
DRSG TEGADERM 4X4.75 (GAUZE/BANDAGES/DRESSINGS) ×2 IMPLANT
ELECT CAUTERY BLADE 6.4 (BLADE) ×2 IMPLANT
ELECT REM PT RETURN 9FT ADLT (ELECTROSURGICAL) ×2
ELECTRODE REM PT RTRN 9FT ADLT (ELECTROSURGICAL) ×1 IMPLANT
GAUZE SPONGE 2X2 8PLY STRL LF (GAUZE/BANDAGES/DRESSINGS) ×1 IMPLANT
GAUZE SPONGE 4X4 16PLY XRAY LF (GAUZE/BANDAGES/DRESSINGS) ×2 IMPLANT
GLOVE BIO SURGEON STRL SZ7.5 (GLOVE) ×2 IMPLANT
GLOVE BIOGEL PI IND STRL 7.5 (GLOVE) ×1 IMPLANT
GLOVE BIOGEL PI INDICATOR 7.5 (GLOVE) ×1
GLOVE SURG SIGNA 7.5 PF LTX (GLOVE) ×2 IMPLANT
GOWN PREVENTION PLUS XLARGE (GOWN DISPOSABLE) ×2 IMPLANT
GOWN STRL NON-REIN LRG LVL3 (GOWN DISPOSABLE) ×2 IMPLANT
KIT BASIN OR (CUSTOM PROCEDURE TRAY) ×2 IMPLANT
KIT MARKER MARGIN INK (KITS) IMPLANT
KIT ROOM TURNOVER OR (KITS) ×2 IMPLANT
NEEDLE HYPO 25GX1X1/2 BEV (NEEDLE) ×2 IMPLANT
NS IRRIG 1000ML POUR BTL (IV SOLUTION) ×2 IMPLANT
PACK SURGICAL SETUP 50X90 (CUSTOM PROCEDURE TRAY) ×2 IMPLANT
PAD ARMBOARD 7.5X6 YLW CONV (MISCELLANEOUS) ×2 IMPLANT
PENCIL BUTTON HOLSTER BLD 10FT (ELECTRODE) ×2 IMPLANT
SPONGE GAUZE 2X2 STER 10/PKG (GAUZE/BANDAGES/DRESSINGS) ×1
STRIP CLOSURE SKIN 1/2X4 (GAUZE/BANDAGES/DRESSINGS) ×2 IMPLANT
SUT MNCRL AB 4-0 PS2 18 (SUTURE) ×4 IMPLANT
SUT VIC AB 3-0 SH 27 (SUTURE) ×2
SUT VIC AB 3-0 SH 27X BRD (SUTURE) ×2 IMPLANT
SYR BULB 3OZ (MISCELLANEOUS) ×2 IMPLANT
SYR CONTROL 10ML LL (SYRINGE) ×2 IMPLANT
TOWEL OR 17X24 6PK STRL BLUE (TOWEL DISPOSABLE) IMPLANT
TOWEL OR 17X26 10 PK STRL BLUE (TOWEL DISPOSABLE) ×2 IMPLANT
TUBE CONNECTING 12X1/4 (SUCTIONS) ×2 IMPLANT
YANKAUER SUCT BULB TIP NO VENT (SUCTIONS) ×2 IMPLANT

## 2011-07-23 NOTE — Anesthesia Procedure Notes (Signed)
Procedure Name: LMA Insertion Date/Time: 07/23/2011 11:42 AM Performed by: Leona Singleton A. Oxygen Delivery Method: Circle System Utilized Preoxygenation: Pre-oxygenation with 100% oxygen Intubation Type: IV induction LMA: LMA inserted LMA Size: 4.0 Tube type: Oral Number of attempts: 1 Placement Confirmation: positive ETCO2 and breath sounds checked- equal and bilateral Tube secured with: Tape Dental Injury: Teeth and Oropharynx as per pre-operative assessment

## 2011-07-23 NOTE — Anesthesia Postprocedure Evaluation (Signed)
  Anesthesia Post-op Note  Patient: Ashley Walker  Procedure(s) Performed:  LUMPECTOMY - RIGHT BREAST NEEDLE LOCALIZED LUMPECTOMY X 2  Patient Location: PACU  Anesthesia Type: General  Level of Consciousness: awake, sedated and patient cooperative  Airway and Oxygen Therapy: Patient Spontanous Breathing  Post-op Pain: mild  Post-op Assessment: Post-op Vital signs reviewed, Patient's Cardiovascular Status Stable, Respiratory Function Stable, RESPIRATORY FUNCTION UNSTABLE, No signs of Nausea or vomiting and Pain level controlled  Post-op Vital Signs: stable  Complications: No apparent anesthesia complications

## 2011-07-23 NOTE — H&P (Signed)
HPI  Ashley Walker is a 35 y.o. female.  HPI  She is referred by Dr. Gaynell Face after recent mammograms demonstrated masses in the right breast. She has had stereotactic biopsy of both these masses confirming adenomatous. Surgical removal as been recommended. 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 She has no previous history of malignancy. She denies nipple discharge.  Past Medical History   Diagnosis  Date   .  Heart murmur    .  Anemia    .  Breast mass in female  november 2012     two in right breast    Past Surgical History   Procedure  Date   .  Inguinal hernia repair    .  Fibroid cyst in breast    .  Cesarean section      2 previous c sections    Family History   Problem  Relation  Age of Onset   .  Hypertension  Father    .  Hypertension  Mother     Social History  History   Substance Use Topics   .  Smoking status:  Former Games developer   .  Smokeless tobacco:  Never Used   .  Alcohol Use:  No    Allergies   Allergen  Reactions   .  Penicillins  Hives    No current outpatient prescriptions on file.    Review of Systems  Review of Systems  Constitutional: Negative for fever, chills and unexpected weight change.  HENT: Negative for hearing loss, congestion, sore throat, trouble swallowing and voice change.  Eyes: Negative for visual disturbance.  Respiratory: Negative for cough and wheezing.  Cardiovascular: Negative for chest pain, palpitations and leg swelling.  Gastrointestinal: Negative for nausea, vomiting, abdominal pain, diarrhea, constipation, blood in stool, abdominal distention and anal bleeding.  Genitourinary: Negative for hematuria, vaginal bleeding and difficulty urinating.  Musculoskeletal: Negative for arthralgias.  Skin: Negative for rash and wound.  Neurological: Negative for seizures, syncope and headaches.  Hematological: Negative for adenopathy. Does not bruise/bleed easily.  Psychiatric/Behavioral: Negative for  confusion.   Blood pressure 126/84, pulse 60, temperature 96.8 F (36 C), temperature source Temporal, resp. rate 18, height 5\' 2"  (1.575 m), weight 189 lb 8 oz (85.957 kg), last menstrual period 05/15/2011.  Physical Exam  Physical Exam  Constitutional: She is oriented to person, place, and time. She appears well-developed and well-nourished. No distress.  HENT:  Head: Normocephalic and atraumatic.  Right Ear: External ear normal.  Left Ear: External ear normal.  Nose: Nose normal.  Mouth/Throat: Oropharynx is clear and moist. No oropharyngeal exudate.  Eyes: Conjunctivae are normal. Pupils are equal, round, and reactive to light. No scleral icterus.  Neck: Normal range of motion. Neck supple. No tracheal deviation present. No thyromegaly present.  Cardiovascular: Normal rate, regular rhythm, normal heart sounds and intact distal pulses.  No murmur heard.  Pulmonary/Chest: Effort normal and breath sounds normal. No respiratory distress. She has no wheezes. She has no rales.  Abdominal: Soft. She exhibits no distension. There is no tenderness. There is no rebound.  Musculoskeletal: Normal range of motion. She exhibits no edema and no tenderness.  Lymphadenopathy:  She has no cervical adenopathy.  Neurological: She is alert and oriented to person, place, and time.  Skin: Skin is warm and dry. No rash noted. No erythema.  Psychiatric: Her behavior is normal. Judgment normal.  Breast exam bilaterally. She has a well-healed incision around the  right areola. She is to do a biopsy sites. She has a vague fullness at the 12:00 position. I cannot palpate any other masses. There is shotty bilateral axillary adenopathy  Data Reviewed  I have reviewed the patient's mammograms, ultrasound, and biopsy. These revealed 2 adenomas of the right breast  Assessment   Patient with 2 separate right breast masses of uncertain malignant potential   Plan   Needle localized removal of these areas is  recommended for histologic evaluation. I discussed this with the patient in detail. I discussed the risk of surgery which includes but not limited to bleeding, infection, injury, need for further surgery should malignancy be present, et Karie Soda. She understands and wishes to proceed. Likelihood of success is good.

## 2011-07-23 NOTE — Preoperative (Signed)
Beta Blockers   Reason not to administer Beta Blockers:Not Applicable 

## 2011-07-23 NOTE — Op Note (Signed)
07/23/2011  12:23 PM  PATIENT:  Ashley Walker  35 y.o. female  PRE-OPERATIVE DIAGNOSIS:  Right breast mass X 2   POST-OPERATIVE DIAGNOSIS:  Right breast mass X 2   PROCEDURE:  Procedure(s) NEEDLE LOCALIZED RIGHT BREAST LUMPECTOMY X 2 (3 O'CLOCK AND 12 O'CLOCK)  SURGEON:  Surgeon(s): Shelly Rubenstein, MD  PHYSICIAN ASSISTANT:   ASSISTANTS: none   ANESTHESIA:   local and general  EBL:  Total I/O In: 1000 [I.V.:1000] Out: 50 [Blood:50]  BLOOD ADMINISTERED:none  DRAINS: none   LOCAL MEDICATIONS USED:  MARCAINE 20CC  SPECIMEN:  Excision  DISPOSITION OF SPECIMEN:  PATHOLOGY  COUNTS:  YES  TOURNIQUET:  * No tourniquets in log *  DICTATION: Reubin Milan Dictation  Indications: This is a female who has had stereotactic biopsy of 2 suspicious areas in her right breast. The area showed adenomatous changes. Decision was made to perform lumpectomies under needle localization of both these areas  Procedure in detail: The patient had already been to the breast center and had localization wires placed at the 12:00 position and 3 clock position of her right breast. She was taken to the operating room and identified correctly. She was placed the operating room table and general anesthesia was induced. Her right breast was then prepped and draped in the usual sterile fashion.   I turned my attention To the 12:00 lesion first. I made a transverse incision incorporating the wire. I then performed a wide lumpectomy around the specimen including the localization wire. I took this all the way down to the chest wall. Once the specimen was completely removed x-ray confirmed that suspicious area and the previously placed clip were in the specimen. This was then sent to pathology for evaluation. Next I made a transverse incision of the breast at the 3:00 position at the other wire. I again performed a wide lumpectomy going down to the chest wall. This again incorporated all the tissue around the  localization wire. X-ray was performed on the specimen and again the suspicious area and clips were located in the specimen. This was confirmed by the radiologist. The specimen likewise was sent to pathology for evaluation. I irrigated with saline. Hemostasis was achieved with cautery. I anesthetized both wounds with Marcaine. I then closed subcutaneous tissue of both wounds with 3-0 Vicryl sutures and closed  the skin with running 4-0 Monocryl sutures.  Steri-Strips, gauze, and bandages were applied. The patient tolerated procedure well. All counts were correct at the end of the procedure. The patient was extubated in the operating room and taken in stable condition to the recovery room. PLAN OF CARE: Discharge to home after PACU  PATIENT DISPOSITION:  PACU - hemodynamically stable.   Delay start of Pharmacological VTE agent (>24hrs) due to surgical blood loss or risk of bleeding:  {YES/NO/NOT APPLICABLE:20182

## 2011-07-23 NOTE — Interval H&P Note (Signed)
History and Physical Interval Note: she has had no change in her exam since I saw her last.  Has 2 masses in her right breast.  Lungs are clear and heart is normal  07/23/2011 7:24 AM  Valetta Close  has presented today for surgery, with the diagnosis of Right  breast mass X 2   The various methods of treatment have been discussed with the patient and family. After consideration of risks, benefits and other options for treatment, the patient has consented to  Procedure(s): LUMPECTOMY right breast  as a surgical intervention .  The patients' history has been reviewed, patient examined, no change in status, stable for surgery.  I have reviewed the patients' chart and labs.  Questions were answered to the patient's satisfaction.     Yuvia Plant A

## 2011-07-23 NOTE — Anesthesia Preprocedure Evaluation (Addendum)
Anesthesia Evaluation  Patient identified by MRN, date of birth, ID band Patient awake    Reviewed: Allergy & Precautions, H&P , NPO status , Patient's Chart, lab work & pertinent test results  Airway Mallampati: I TM Distance: >3 FB Neck ROM: full    Dental  (+) Teeth Intact   Pulmonary neg pulmonary ROS,    Pulmonary exam normal       Cardiovascular + Valvular Problems/Murmurs regular Normal    Neuro/Psych  Headaches, Seizures -,     GI/Hepatic negative GI ROS, Neg liver ROS,   Endo/Other  Negative Endocrine ROS  Renal/GU negative Renal ROS  Genitourinary negative   Musculoskeletal   Abdominal Normal abdominal exam  (+)   Peds  Hematology negative hematology ROS (+)   Anesthesia Other Findings   Reproductive/Obstetrics                         Anesthesia Physical Anesthesia Plan  ASA: I  Anesthesia Plan: General LMA and General   Post-op Pain Management:    Induction: Intravenous  Airway Management Planned: LMA  Additional Equipment:   Intra-op Plan:   Post-operative Plan: Extubation in OR  Informed Consent: I have reviewed the patients History and Physical, chart, labs and discussed the procedure including the risks, benefits and alternatives for the proposed anesthesia with the patient or authorized representative who has indicated his/her understanding and acceptance.     Plan Discussed with: CRNA, Surgeon and Anesthesiologist  Anesthesia Plan Comments:         Anesthesia Quick Evaluation

## 2011-07-23 NOTE — Transfer of Care (Signed)
Immediate Anesthesia Transfer of Care Note  Patient: Ashley Walker  Procedure(s) Performed:  LUMPECTOMY - RIGHT BREAST NEEDLE LOCALIZED LUMPECTOMY X 2  Patient Location: PACU  Anesthesia Type: General  Level of Consciousness: awake  Airway & Oxygen Therapy: Patient Spontanous Breathing and Patient connected to nasal cannula oxygen  Post-op Assessment: Report given to PACU RN and Post -op Vital signs reviewed and stable  Post vital signs: Reviewed and stable  Complications: No apparent anesthesia complications

## 2011-07-23 NOTE — Progress Notes (Signed)
Pt up to restroom via wheelchair to void, tolerated well.

## 2011-07-27 ENCOUNTER — Encounter (HOSPITAL_COMMUNITY): Payer: Self-pay | Admitting: Surgery

## 2011-08-17 ENCOUNTER — Encounter (INDEPENDENT_AMBULATORY_CARE_PROVIDER_SITE_OTHER): Payer: PRIVATE HEALTH INSURANCE | Admitting: Surgery

## 2011-08-27 ENCOUNTER — Encounter (INDEPENDENT_AMBULATORY_CARE_PROVIDER_SITE_OTHER): Payer: Self-pay | Admitting: Surgery

## 2011-09-02 ENCOUNTER — Encounter: Payer: Self-pay | Admitting: Obstetrics and Gynecology

## 2011-09-02 ENCOUNTER — Telehealth: Payer: Self-pay | Admitting: *Deleted

## 2011-09-02 NOTE — Telephone Encounter (Signed)
Attempted to call patient to follow up since did not show up for follow up appointment with Dr. Magnus Ivan. Patient's phone has been disconnected. Unable to reach patient by phone. Will send patient certified letter.

## 2014-06-10 ENCOUNTER — Encounter (HOSPITAL_COMMUNITY): Payer: Self-pay | Admitting: Surgery

## 2014-08-04 ENCOUNTER — Emergency Department (HOSPITAL_COMMUNITY)
Admission: EM | Admit: 2014-08-04 | Discharge: 2014-08-04 | Disposition: A | Discharge status: Home / Self Care | Payer: Medicaid Other | Attending: Emergency Medicine | Admitting: Emergency Medicine

## 2014-08-04 ENCOUNTER — Encounter (HOSPITAL_COMMUNITY): Payer: Self-pay | Admitting: Nurse Practitioner

## 2014-08-04 DIAGNOSIS — Z862 Personal history of diseases of the blood and blood-forming organs and certain disorders involving the immune mechanism: Secondary | ICD-10-CM | POA: Insufficient documentation

## 2014-08-04 DIAGNOSIS — J069 Acute upper respiratory infection, unspecified: Secondary | ICD-10-CM | POA: Insufficient documentation

## 2014-08-04 DIAGNOSIS — R011 Cardiac murmur, unspecified: Secondary | ICD-10-CM | POA: Insufficient documentation

## 2014-08-04 DIAGNOSIS — Z87891 Personal history of nicotine dependence: Secondary | ICD-10-CM | POA: Insufficient documentation

## 2014-08-04 DIAGNOSIS — Z8742 Personal history of other diseases of the female genital tract: Secondary | ICD-10-CM | POA: Insufficient documentation

## 2014-08-04 DIAGNOSIS — Z880 Allergy status to penicillin: Secondary | ICD-10-CM | POA: Insufficient documentation

## 2014-08-04 MED ORDER — IBUPROFEN 400 MG PO TABS
600.0000 mg | ORAL_TABLET | ORAL | Status: AC
  Administered 2014-08-04: 600 mg via ORAL
  Filled 2014-08-04 (×2): qty 1

## 2014-08-04 MED ORDER — ALBUTEROL SULFATE HFA 108 (90 BASE) MCG/ACT IN AERS: 2.0000 | INHALATION_SPRAY | RESPIRATORY_TRACT | Status: AC | PRN

## 2014-08-04 MED ORDER — PREDNISONE 20 MG PO TABS: 40.0000 mg | ORAL_TABLET | Freq: Every day | ORAL | Status: DC

## 2014-08-04 MED ORDER — ACETAMINOPHEN 325 MG PO TABS
650.0000 mg | ORAL_TABLET | ORAL | Status: AC
  Administered 2014-08-04: 650 mg via ORAL
  Filled 2014-08-04: qty 2

## 2014-08-04 NOTE — ED Provider Notes (Signed)
CSN: [id number]     Arrival date & time 08/04/14  1742 History   First MD Initiated Contact with Patient 08/04/14 2008     Chief Complaint  Patient presents with  . Headache  . URI   (Consider location/radiation/quality/duration/timing/severity/associated sxs/prior Treatment) HPI Ashley Walker is a 38 yo female presenting with report of cough, nasal congestion, and headache x 2 weeks.  She states she has a running nose and nasal congestion with green or yellow mucous that has not improved despite taking mucinex.  Her headache is described as intermittent and gradual in onset.  Her cough is bothersome and worse when she is trying to sleep.  She denies fever, chills, chest pain, nausea, vomiting or diarrhea.  Past Medical History  Diagnosis Date  . Anemia   . Breast mass in female november 2012    two in right breast  . Heart murmur     diagnosed as a child  . Headache(784.0)   . Seizures     as a child d/t a fall and this is the only one shes ever had   Past Surgical History  Procedure Laterality Date  . Fibroid cyst in breast  1991/1991  . Cesarean section  2007/1997    2 previous c sections  . Inguinal hernia repair  1999  . Breast lumpectomy  07/23/2011    Procedure: LUMPECTOMY;  Surgeon: Harl Bowie, MD;  Location: [facility];  Service: General;  Laterality: Right;  RIGHT BREAST NEEDLE LOCALIZED LUMPECTOMY X 2   Family History  Problem Relation Age of Onset  . Hypertension Father   . Hypertension Mother   . Anesthesia problems Neg Hx   . Pseudochol deficiency Neg Hx   . Malignant hyperthermia Neg Hx   . Hypotension Neg Hx    History  Substance Use Topics  . Smoking status: Former Smoker -- 0.25 packs/day  . Smokeless tobacco: Never Used  . Alcohol Use: No   OB History    Gravida Para Term Preterm AB TAB SAB Ectopic Multiple Living   4 4 3 1      4      Review of Systems  Constitutional: Negative for fever and chills.  HENT: Positive for congestion and  rhinorrhea. Negative for sore throat.   Eyes: Negative for visual disturbance.  Respiratory: Positive for cough. Negative for shortness of breath.   Cardiovascular: Negative for chest pain and leg swelling.  Gastrointestinal: Negative for nausea, vomiting and diarrhea.  Genitourinary: Negative for dysuria.  Musculoskeletal: Negative for myalgias.  Skin: Negative for rash.  Neurological: Positive for headaches. Negative for weakness and numbness.    Allergies  Penicillins  Home Medications   Prior to Admission medications   Not on File   BP 135/76 mmHg  Pulse 87  Temp(Src) 98 F (36.7 C) (Oral)  Resp 15  SpO2 98% Physical Exam  Constitutional: She is oriented to person, place, and time. She appears well-developed and well-nourished. No distress.  HENT:  Head: Normocephalic and atraumatic.  Nose: Right sinus exhibits frontal sinus tenderness. Left sinus exhibits frontal sinus tenderness.  Mouth/Throat: Oropharynx is clear and moist. No oropharyngeal exudate.  Eyes: Conjunctivae are normal.  Neck: Neck supple. No thyromegaly present.  Cardiovascular: Normal rate, regular rhythm and intact distal pulses.   Pulmonary/Chest: Effort normal and breath sounds normal. No respiratory distress. She has no wheezes. She has no rales. She exhibits no tenderness.  Abdominal: Soft. There is no tenderness.  Musculoskeletal: She exhibits no tenderness.  Lymphadenopathy:    She has no cervical adenopathy.  Neurological: She is alert and oriented to person, place, and time. No cranial nerve deficit. Coordination normal.  Skin: Skin is warm and dry. No rash noted. She is not diaphoretic.  Nursing note and vitals reviewed.   ED Course  Procedures (including critical care time) Labs Review Labs Reviewed - No data to display  Imaging Review No results found.   EKG Interpretation None      MDM   Final diagnoses:  URI (upper respiratory infection)   38 yo with symptoms consistent  with URI, likely viral etiology including bothersome non-productive cough and gradual onset, intermittent headache.  Discussed that antibiotics are not indicated for viral infections. Discussed options for symptomatic treatment. Pt is well-appearing, in no acute distress and vital signs are stable.  They appear safe to be discharged.  Discharge include follow-up with their PCP.  Return precautions provided.  Verbalizes understanding and is agreeable with plan.   Filed Vitals:   08/04/14 1818 08/04/14 2058  BP: 135/76 131/71  Pulse: 87 70  Temp: 98 F (36.7 C) 97.8 F (36.6 C)  TempSrc: Oral Oral  Resp: 15 18  SpO2: 98% 98%   Meds given in ED:  Medications  acetaminophen (TYLENOL) tablet 650 mg (650 mg Oral Given 08/04/14 2059)  ibuprofen (ADVIL,MOTRIN) tablet 600 mg (600 mg Oral Given 08/04/14 2059)    Discharge Medication List as of 08/04/2014  8:58 PM    START taking these medications   Details  albuterol (PROVENTIL HFA;VENTOLIN HFA) 108 (90 BASE) MCG/ACT inhaler Inhale 2 puffs into the lungs every 4 (four) hours as needed for wheezing or shortness of breath., Starting 08/04/2014, Until Discontinued, Print    predniSONE (DELTASONE) 20 MG tablet Take 2 tablets (40 mg total) by mouth daily., Starting 08/04/2014, Until Discontinued, Print            Britt Bottom, NP 08/05/14 [place] Alvino Chapel, MD 08/07/14 1320

## 2014-08-04 NOTE — Discharge Instructions (Signed)
Please follow the directions provided.  Be sure to establish care with a primary care provider to ensure you are getting better.  While you have this upper respiratory infection, you may have a headache from time to time.  It is important to treat the symptoms as antibiotics will not help.  Be sure to drink plenty of water, a good rule of thumb is try to drink have your weight in ounces per day.  You should try to drink between 80-100 ounces of water.  You will notice when you are hydrated you will start to feel better.  You may take Tylenol 650mg  -1000mg  by mouth every 4 hours.  Use the prednisone daily for the next 5 days and use your inhaler 2 puffs every 4 hours to help minimize your cough.  Over the counter medicines that contain a decongestant and an expectorant will help minimize your symptoms also.  Unfortunately these viruses will take time to completely resolve.  Don't hesitate to return for any new, worsening or concerning symptoms.    SEEK IMMEDIATE MEDICAL CARE IF:  You have a fever.  You develop severe or persistent headache, ear pain, sinus pain, or chest pain.  You develop wheezing, a prolonged cough, cough up blood, or have a change in your usual mucus (if you have chronic lung disease).  You develop sore muscles or a stiff neck.

## 2014-08-04 NOTE — ED Notes (Signed)
She c/o headaches and cold symptoms x 2  Weeks. shes had cough with green mucous, runny nose. She took robitussin and tylenol cold with no relief.

## 2014-08-08 ENCOUNTER — Emergency Department (HOSPITAL_COMMUNITY)
Admission: EM | Admit: 2014-08-08 | Discharge: 2014-08-08 | Disposition: A | Discharge status: Home / Self Care | Payer: Medicaid Other | Attending: Emergency Medicine | Admitting: Emergency Medicine

## 2014-08-08 ENCOUNTER — Encounter (HOSPITAL_COMMUNITY): Payer: Self-pay | Admitting: *Deleted

## 2014-08-08 DIAGNOSIS — Z7952 Long term (current) use of systemic steroids: Secondary | ICD-10-CM | POA: Insufficient documentation

## 2014-08-08 DIAGNOSIS — Z87891 Personal history of nicotine dependence: Secondary | ICD-10-CM | POA: Insufficient documentation

## 2014-08-08 DIAGNOSIS — Z880 Allergy status to penicillin: Secondary | ICD-10-CM | POA: Insufficient documentation

## 2014-08-08 DIAGNOSIS — H6123 Impacted cerumen, bilateral: Secondary | ICD-10-CM

## 2014-08-08 DIAGNOSIS — Z79899 Other long term (current) drug therapy: Secondary | ICD-10-CM | POA: Insufficient documentation

## 2014-08-08 DIAGNOSIS — R011 Cardiac murmur, unspecified: Secondary | ICD-10-CM | POA: Insufficient documentation

## 2014-08-08 DIAGNOSIS — Z862 Personal history of diseases of the blood and blood-forming organs and certain disorders involving the immune mechanism: Secondary | ICD-10-CM | POA: Insufficient documentation

## 2014-08-08 DIAGNOSIS — H9202 Otalgia, left ear: Secondary | ICD-10-CM

## 2014-08-08 MED ORDER — NAPROXEN 500 MG PO TABS: 500.0000 mg | ORAL_TABLET | Freq: Two times a day (BID) | ORAL | Status: DC

## 2014-08-08 MED ORDER — CARBAMIDE PEROXIDE 6.5 % OT SOLN: 5.0000 [drp] | Freq: Two times a day (BID) | OTIC | Status: DC

## 2014-08-08 NOTE — ED Notes (Signed)
Pt was seen here on 12/27 for URI, now having sharp pains to left ear for several days.

## 2014-08-08 NOTE — ED Provider Notes (Signed)
CSN: [id number]     Arrival date & time 08/08/14  1701 History  This chart was scribed for non-physician practitioner working with Quintella Reichert, MD by Mercy Moore, ED Scribe. This patient was seen in room TR09C/TR09C and the patient's care was started at 6:25 PM.   Chief Complaint  Patient presents with  . Otalgia   HPI HPI Comments: Ashley Walker is a 38 y.o. female with PMHx of anemia, heart murmur and seizures who presents to the Emergency Department complaining of intermittent sharp pain in her left ear ongoing for three days. Patient severe pain last night that disturbed her sleep. Patient evaluated for cold symptoms 12/27; patient diagnosed with URI and discharged with Tylenol, Prednisone and an inhaler for treatment of her headache and cough. Patient denies this ear pain at her last visit. Patient denies sore throat, pain with swallowing, cough, or rhinorrhea. Patient reports treatment with Goodie powder, but denies relief. Patient reports cleaning her ears with Qtips, but recently she has been cleaning with wet wash cloth.   Past Medical History  Diagnosis Date  . Anemia   . Breast mass in female november 2012    two in right breast  . Heart murmur     diagnosed as a child  . Headache(784.0)   . Seizures     as a child d/t a fall and this is the only one shes ever had   Past Surgical History  Procedure Laterality Date  . Fibroid cyst in breast  1991/1991  . Cesarean section  2007/1997    2 previous c sections  . Inguinal hernia repair  1999  . Breast lumpectomy  07/23/2011    Procedure: LUMPECTOMY;  Surgeon: Harl Bowie, MD;  Location: [facility];  Service: General;  Laterality: Right;  RIGHT BREAST NEEDLE LOCALIZED LUMPECTOMY X 2   Family History  Problem Relation Age of Onset  . Hypertension Father   . Hypertension Mother   . Anesthesia problems Neg Hx   . Pseudochol deficiency Neg Hx   . Malignant hyperthermia Neg Hx   . Hypotension Neg Hx    History   Substance Use Topics  . Smoking status: Former Smoker -- 0.25 packs/day  . Smokeless tobacco: Never Used  . Alcohol Use: No   OB History    Gravida Para Term Preterm AB TAB SAB Ectopic Multiple Living   4 4 3 1      4      Review of Systems  Constitutional: Negative for fever and chills.  HENT: Positive for ear pain. Negative for ear discharge, rhinorrhea, sore throat and trouble swallowing.   Neurological: Negative for headaches.  All other systems reviewed and are negative.  Allergies  Penicillins  Home Medications   Prior to Admission medications   Medication Sig Start Date End Date Taking? Authorizing Provider  albuterol (PROVENTIL HFA;VENTOLIN HFA) 108 (90 BASE) MCG/ACT inhaler Inhale 2 puffs into the lungs every 4 (four) hours as needed for wheezing or shortness of breath. 08/04/14   Britt Bottom, NP  predniSONE (DELTASONE) 20 MG tablet Take 2 tablets (40 mg total) by mouth daily. 08/04/14   Britt Bottom, NP   BP 147/97 mmHg  Pulse 62  Temp(Src) 98.1 F (36.7 C) (Oral)  Resp 18  SpO2 99%  LMP 07/16/2014 Physical Exam  Constitutional: She is oriented to person, place, and time. She appears well-developed and well-nourished. No distress.  HENT:  Head: Normocephalic and atraumatic.  Right Ear: External ear normal.  Left  Ear: External ear normal.  R TM obscured by wax. L TM clear.   Eyes: EOM are normal.  Neck: Neck supple. No tracheal deviation present.  Cardiovascular: Normal rate.   Pulmonary/Chest: Effort normal. No respiratory distress.  Musculoskeletal: Normal range of motion.  Neurological: She is alert and oriented to person, place, and time.  Skin: Skin is warm and dry.  Psychiatric: She has a normal mood and affect. Her behavior is normal.  Nursing note and vitals reviewed.   ED Course  Procedures (including critical care time)  COORDINATION OF CARE: 6:32 PM- Patient advised to use OTC Debrox to clear wax from her right canal and treat  any discomfort with anti inflammatory. Discussed treatment plan with patient at bedside and patient agreed to plan.   Labs Review Labs Reviewed - No data to display  Imaging Review No results found.   EKG Interpretation None     Left otalgia.  TM clear, no erythema. Mild cerumen build-up. No fever, sore throat.  Recently diagnosed and treated for URI. MDM   Final diagnoses:  None    Left otalgia.  I personally performed the services described in this documentation, which was scribed in my presence. The recorded information has been reviewed and is accurate.    Norman Herrlich, NP 08/08/14 1942  Quintella Reichert, MD 08/08/14 2006

## 2014-08-08 NOTE — Discharge Instructions (Signed)
Otalgia Otalgia is pain in or around the ear. When the pain is from the ear itself it is called primary otalgia. Pain may also be coming from somewhere else, like the head and neck. This is called secondary otalgia.  CAUSES  Causes of primary otalgia include:  Middle ear infection.  It can also be caused by injury to the ear or infection of the ear canal (swimmer's ear). Swimmer's ear causes pain, swelling and often drainage from the ear canal. Causes of secondary otalgia include:  Sinus infections.  Allergies and colds that cause stuffiness of the nose and tubes that drain the ears (eustachian tubes).  Dental problems like cavities, gum infections or teething.  Sore Throat (tonsillitis and pharyngitis).  Swollen glands in the neck.  Infection of the bone behind the ear (mastoiditis).  TMJ discomfort (problems with the joint between your jaw and your skull).  Other problems such as nerve disorders, circulation problems, heart disease and tumors of the head and neck can also cause symptoms of ear pain. This is rare. DIAGNOSIS  Evaluation, Diagnosis and Testing:  Examination by your medical caregiver is recommended to evaluate and diagnose the cause of otalgia.  Further testing or referral to a specialist may be indicated if the cause of the ear pain is not found and the symptom persists. TREATMENT   Your doctor may prescribe antibiotics if an ear infection is diagnosed.  Pain relievers and topical analgesics may be recommended.  It is important to take all medications as prescribed. HOME CARE INSTRUCTIONS   It may be helpful to sleep with the painful ear in the up position.  A warm compress over the painful ear may provide relief.  A soft diet and avoiding gum may help while ear pain is present. SEEK IMMEDIATE MEDICAL CARE IF:  You develop severe pain, a high fever, repeated vomiting or dehydration.  You develop extreme dizziness, headache, confusion, ringing in the  ears (tinnitus) or hearing loss. Document Released: 09/02/2004 Document Revised: 10/18/2011 Document Reviewed: 06/04/2009 [facility] Patient Information 2015 [place], [place]. This information is not intended to replace advice given to you by your health care provider. Make sure you discuss any questions you have with your health care provider.  Cerumen Impaction A cerumen impaction is when the wax in your ear forms a plug. This plug usually causes reduced hearing. Sometimes it also causes an earache or dizziness. Removing a cerumen impaction can be difficult and painful. The wax sticks to the ear canal. The canal is sensitive and bleeds easily. If you try to remove a heavy wax buildup with a cotton tipped swab, you may push it in further. Irrigation with water, suction, and small ear curettes may be used to clear out the wax. If the impaction is fixed to the skin in the ear canal, ear drops may be needed for a few days to loosen the wax. People who build up a lot of wax frequently can use ear wax removal products available in your local drugstore. SEEK MEDICAL CARE IF:  You develop an earache, increased hearing loss, or marked dizziness. Document Released: 09/02/2004 Document Revised: 10/18/2011 Document Reviewed: 10/23/2009 [facility] Patient Information 2015 [place], [place]. This information is not intended to replace advice given to you by your health care provider. Make sure you discuss any questions you have with your health care provider.

## 2014-08-08 NOTE — ED Notes (Signed)
NP at BS.

## 2015-05-16 ENCOUNTER — Encounter (HOSPITAL_COMMUNITY): Payer: Self-pay | Admitting: Emergency Medicine

## 2015-05-16 ENCOUNTER — Emergency Department (HOSPITAL_COMMUNITY)
Admission: EM | Admit: 2015-05-16 | Discharge: 2015-05-16 | Disposition: A | Discharge status: Home / Self Care | Payer: Medicaid Other | Attending: Emergency Medicine | Admitting: Emergency Medicine

## 2015-05-16 DIAGNOSIS — R011 Cardiac murmur, unspecified: Secondary | ICD-10-CM | POA: Insufficient documentation

## 2015-05-16 DIAGNOSIS — Z79899 Other long term (current) drug therapy: Secondary | ICD-10-CM | POA: Diagnosis not present

## 2015-05-16 DIAGNOSIS — Z862 Personal history of diseases of the blood and blood-forming organs and certain disorders involving the immune mechanism: Secondary | ICD-10-CM | POA: Diagnosis not present

## 2015-05-16 DIAGNOSIS — R6884 Jaw pain: Secondary | ICD-10-CM | POA: Diagnosis not present

## 2015-05-16 DIAGNOSIS — R51 Headache: Secondary | ICD-10-CM | POA: Diagnosis present

## 2015-05-16 DIAGNOSIS — Z87891 Personal history of nicotine dependence: Secondary | ICD-10-CM | POA: Insufficient documentation

## 2015-05-16 DIAGNOSIS — Z8742 Personal history of other diseases of the female genital tract: Secondary | ICD-10-CM | POA: Diagnosis not present

## 2015-05-16 DIAGNOSIS — R42 Dizziness and giddiness: Secondary | ICD-10-CM | POA: Insufficient documentation

## 2015-05-16 DIAGNOSIS — R519 Headache, unspecified: Secondary | ICD-10-CM

## 2015-05-16 DIAGNOSIS — Z880 Allergy status to penicillin: Secondary | ICD-10-CM | POA: Insufficient documentation

## 2015-05-16 MED ORDER — TRAMADOL HCL 50 MG PO TABS: 50.0000 mg | ORAL_TABLET | Freq: Four times a day (QID) | ORAL | Status: DC | PRN

## 2015-05-16 MED ORDER — ACETAMINOPHEN 325 MG PO TABS
650.0000 mg | ORAL_TABLET | Freq: Once | ORAL | Status: AC
  Administered 2015-05-16: 650 mg via ORAL
  Filled 2015-05-16: qty 2

## 2015-05-16 NOTE — Discharge Instructions (Signed)
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
## 2015-07-08 ENCOUNTER — Encounter (HOSPITAL_COMMUNITY): Payer: Self-pay | Admitting: *Deleted

## 2015-07-08 ENCOUNTER — Emergency Department (HOSPITAL_COMMUNITY)
Admission: EM | Admit: 2015-07-08 | Discharge: 2015-07-08 | Disposition: A | Discharge status: Home / Self Care | Payer: No Typology Code available for payment source | Attending: Emergency Medicine | Admitting: Emergency Medicine

## 2015-07-08 DIAGNOSIS — Y9389 Activity, other specified: Secondary | ICD-10-CM | POA: Insufficient documentation

## 2015-07-08 DIAGNOSIS — M542 Cervicalgia: Secondary | ICD-10-CM

## 2015-07-08 DIAGNOSIS — Z87891 Personal history of nicotine dependence: Secondary | ICD-10-CM | POA: Insufficient documentation

## 2015-07-08 DIAGNOSIS — S0990XA Unspecified injury of head, initial encounter: Secondary | ICD-10-CM | POA: Insufficient documentation

## 2015-07-08 DIAGNOSIS — Z8742 Personal history of other diseases of the female genital tract: Secondary | ICD-10-CM | POA: Diagnosis not present

## 2015-07-08 DIAGNOSIS — R011 Cardiac murmur, unspecified: Secondary | ICD-10-CM | POA: Insufficient documentation

## 2015-07-08 DIAGNOSIS — Z862 Personal history of diseases of the blood and blood-forming organs and certain disorders involving the immune mechanism: Secondary | ICD-10-CM | POA: Insufficient documentation

## 2015-07-08 DIAGNOSIS — Z880 Allergy status to penicillin: Secondary | ICD-10-CM | POA: Insufficient documentation

## 2015-07-08 DIAGNOSIS — Y999 Unspecified external cause status: Secondary | ICD-10-CM | POA: Diagnosis not present

## 2015-07-08 DIAGNOSIS — S199XXA Unspecified injury of neck, initial encounter: Secondary | ICD-10-CM | POA: Insufficient documentation

## 2015-07-08 DIAGNOSIS — Y92410 Unspecified street and highway as the place of occurrence of the external cause: Secondary | ICD-10-CM | POA: Diagnosis not present

## 2015-07-08 MED ORDER — IBUPROFEN 800 MG PO TABS: 800.0000 mg | ORAL_TABLET | Freq: Three times a day (TID) | ORAL | Status: DC

## 2015-07-08 MED ORDER — IBUPROFEN 400 MG PO TABS
800.0000 mg | ORAL_TABLET | Freq: Once | ORAL | Status: AC
  Administered 2015-07-08: 800 mg via ORAL
  Filled 2015-07-08: qty 2

## 2015-07-08 MED ORDER — METHOCARBAMOL 500 MG PO TABS: 500.0000 mg | ORAL_TABLET | Freq: Two times a day (BID) | ORAL | Status: DC
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
## 2018-03-12 ENCOUNTER — Inpatient Hospital Stay (HOSPITAL_COMMUNITY)
Admission: AD | Admit: 2018-03-12 | Discharge: 2018-03-12 | Disposition: A | Discharge status: Home / Self Care | Payer: Medicaid Other | Source: Ambulatory Visit | Attending: Obstetrics & Gynecology | Admitting: Obstetrics & Gynecology

## 2018-03-12 ENCOUNTER — Encounter (HOSPITAL_COMMUNITY): Payer: Self-pay

## 2018-03-12 ENCOUNTER — Other Ambulatory Visit: Payer: Self-pay

## 2018-03-12 DIAGNOSIS — Z9889 Other specified postprocedural states: Secondary | ICD-10-CM | POA: Insufficient documentation

## 2018-03-12 DIAGNOSIS — R109 Unspecified abdominal pain: Secondary | ICD-10-CM

## 2018-03-12 DIAGNOSIS — N920 Excessive and frequent menstruation with regular cycle: Secondary | ICD-10-CM | POA: Insufficient documentation

## 2018-03-12 DIAGNOSIS — Z79891 Long term (current) use of opiate analgesic: Secondary | ICD-10-CM | POA: Insufficient documentation

## 2018-03-12 DIAGNOSIS — F17210 Nicotine dependence, cigarettes, uncomplicated: Secondary | ICD-10-CM | POA: Diagnosis not present

## 2018-03-12 DIAGNOSIS — Z880 Allergy status to penicillin: Secondary | ICD-10-CM | POA: Insufficient documentation

## 2018-03-12 DIAGNOSIS — Z8742 Personal history of other diseases of the female genital tract: Secondary | ICD-10-CM

## 2018-03-12 DIAGNOSIS — R300 Dysuria: Secondary | ICD-10-CM | POA: Diagnosis present

## 2018-03-12 DIAGNOSIS — Z8249 Family history of ischemic heart disease and other diseases of the circulatory system: Secondary | ICD-10-CM | POA: Insufficient documentation

## 2018-03-12 DIAGNOSIS — Z79899 Other long term (current) drug therapy: Secondary | ICD-10-CM | POA: Insufficient documentation

## 2018-03-12 HISTORY — DX: Papillomavirus as the cause of diseases classified elsewhere: B97.7

## 2018-03-12 LAB — URINALYSIS, ROUTINE W REFLEX MICROSCOPIC
Bilirubin Urine: NEGATIVE
Glucose, UA: NEGATIVE mg/dL
HGB URINE DIPSTICK: NEGATIVE
KETONES UR: NEGATIVE mg/dL
Leukocytes, UA: NEGATIVE
Nitrite: NEGATIVE
PROTEIN: NEGATIVE mg/dL
Specific Gravity, Urine: 1.016 (ref 1.005–1.030)
pH: 6 (ref 5.0–8.0)

## 2018-03-12 LAB — WET PREP, GENITAL
Clue Cells Wet Prep HPF POC: NONE SEEN
Sperm: NONE SEEN
Trich, Wet Prep: NONE SEEN
Yeast Wet Prep HPF POC: NONE SEEN

## 2018-03-12 LAB — POCT PREGNANCY, URINE: Preg Test, Ur: NEGATIVE

## 2018-03-12 NOTE — MAU Note (Signed)
Pt. States she has heavy bleeding and large clots. Clots start when going off. Cycle has usually been 3 days. 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 Told to make sure she does not develop cancer. LMP 02/17/18. States the period lasted 8 days. Reports regular menstrual cramps, no pain.   Pt. Also reports pressure on lower abd. Rated 1/10 intermittent. Cloudy urine with foul odor.Reports more frequent bathroom breaks and pain with urination. Reports this began 1 week ago.

## 2018-03-12 NOTE — MAU Provider Note (Addendum)
Patient Ashley Walker is a 42 y.o.  [phone] At Unknown here with complaints of cloudy smell to her urine, occasional pressure in her lower abdomen and occasional urge to urinate. She also states that her periods have gotten longer over the past year and that she has heavy clots at the end of them. She is not on her period now. She denies abnormal discharge, pelvic pain, itching, odor or lesions.   When  History     CSN: [id number]  Arrival date and time: 03/12/18 3154   First Provider Initiated Contact with Patient 03/12/18 2111      Chief Complaint  Patient presents with  . Abdominal Pain  . Dysuria   Dysuria   The current episode started in the past 7 days. The problem occurs intermittently. The problem has been unchanged. The pain is at a severity of 3/10. There has been no fever. There is no history of pyelonephritis. Pertinent negatives include no chills, discharge, flank pain, frequency, hematuria, hesitancy, nausea, urgency or vomiting. Treatments tried: cranberry juice.    OB History    Gravida  4   Para  4   Term  3   Preterm  1   AB      Living  4     SAB      TAB      Ectopic      Multiple      Live Births              Past Medical History:  Diagnosis Date  . Anemia   . Breast mass in female november 2012   two in right breast  . Headache(784.0)   . Heart murmur    diagnosed as a child  . HPV (human papilloma virus) infection   . Seizures ([place])    as a child d/t a fall and this is the only one shes ever had    Past Surgical History:  Procedure Laterality Date  . BREAST LUMPECTOMY  07/23/2011   Procedure: LUMPECTOMY;  Surgeon: Harl Bowie, MD;  Location: [facility];  Service: General;  Laterality: Right;  RIGHT BREAST NEEDLE LOCALIZED LUMPECTOMY X 2  . CESAREAN SECTION  2007/1997   2 previous c sections  . fibroid cyst in breast  1991/1991  . INGUINAL HERNIA REPAIR  1999    Family History  Problem Relation Age of Onset  .  Hypertension Father   . Hypertension Mother   . Anesthesia problems Neg Hx   . Pseudochol deficiency Neg Hx   . Malignant hyperthermia Neg Hx   . Hypotension Neg Hx     Social History   Tobacco Use  . Smoking status: Current Every Day Smoker    Packs/day: 0.25    Types: Cigarettes  . Smokeless tobacco: Never Used  Substance Use Topics  . Alcohol use: No  . Drug use: No    Allergies:  Allergies  Allergen Reactions  . Penicillins Hives    Has patient had a PCN reaction causing immediate rash, facial/tongue/throat swelling, SOB or lightheadedness with hypotension: No Has patient had a PCN reaction causing severe rash involving mucus membranes or skin necrosis: Yes Has patient had a PCN reaction that required hospitalization -cant remember Has patient had a PCN reaction occurring within the last 10 years: No-childhood allergy If all of the above answers are "NO", then may proceed with Cephalosporin use.     Medications Prior to Admission  Medication Sig Dispense Refill Last Dose  .  ibuprofen (ADVIL,MOTRIN) 800 MG tablet Take 1 tablet (800 mg total) by mouth 3 (three) times daily. 21 tablet 0 Past Month at Unknown time  . albuterol (PROVENTIL HFA;VENTOLIN HFA) 108 (90 BASE) MCG/ACT inhaler Inhale 2 puffs into the lungs every 4 (four) hours as needed for wheezing or shortness of breath. 1 Inhaler 3 unknown  . Aspirin-Acetaminophen-Caffeine (GOODY HEADACHE PO) Take 1 Package by mouth daily as needed (headache/pain).   More than a month at Unknown time  . methocarbamol (ROBAXIN) 500 MG tablet Take 1 tablet (500 mg total) by mouth 2 (two) times daily. 20 tablet 0   . traMADol (ULTRAM) 50 MG tablet Take 1 tablet (50 mg total) by mouth every 6 (six) hours as needed. 15 tablet 0     Review of Systems  Constitutional: Negative for chills.  Gastrointestinal: Negative for nausea and vomiting.  Genitourinary: Positive for dysuria. Negative for flank pain, frequency, hematuria, hesitancy  and urgency.   Physical Exam   Blood pressure 135/62, pulse 86, temperature 98.1 F (36.7 C), temperature source Oral, resp. rate 18, height 5\' 2"  (1.575 m), weight 215 lb (97.5 kg), last menstrual period 02/17/2018, SpO2 100 %.  Physical Exam  Constitutional: She is oriented to person, place, and time. She appears well-developed.  HENT:  Head: Normocephalic.  Neck: Normal range of motion.  Respiratory: Effort normal.  GI: Soft.  Genitourinary: Vagina normal.  Neurological: She is alert and oriented to person, place, and time.  Skin: Skin is warm and dry.  Psychiatric: She has a normal mood and affect.    MAU Course  Procedures  MDM Patient's UA completely clear of signs of infection, wet prep also negative.  GC pending; urine culture not done.  Reviewed with patient that her periods may be changing due to perimenopause. I explained that, since she is not on her period now, I cannot assess her bleeding. She would benefit from seeing a gyn doctor and discussing birth control to regular her cycle. Patient verbalized understanding.   Assessment and Plan   1. History of heavy periods    2. Patient stable for discharge; number for gyn doctor given as well as return instructions. Encouraged PO hydration and plan to return to urgent care if symptoms worsen or change.  3. Patient verbalized understanding of discharge instructions, no questions.   Mervyn Skeeters Natsumi Whitsitt 03/12/2018, 9:16 PM

## 2018-03-12 NOTE — MAU Note (Signed)
Pt here with c/o pain with urination, some abdominal pain. LMP 02/17/18

## 2018-03-12 NOTE — Discharge Instructions (Signed)
-call [facility] at [phone] -birth control may be helpful; go to urgent care if you develop pain with urination.    Perimenopause Perimenopause is the time when your body begins to move into the menopause (no menstrual period for 12 straight months). It is a natural process. Perimenopause can begin 2-8 years before the menopause and usually lasts for 1 year after the menopause. During this time, your ovaries may or may not produce an egg. The ovaries vary in their production of estrogen and progesterone hormones each month. This can cause irregular menstrual periods, difficulty getting pregnant, vaginal bleeding between periods, and uncomfortable symptoms. What are the causes?  Irregular production of the ovarian hormones, estrogen and progesterone, and not ovulating every month. Other causes include:  Tumor of the pituitary gland in the brain.  Medical disease that affects the ovaries.  Radiation treatment.  Chemotherapy.  Unknown causes.  Heavy smoking and excessive alcohol intake can bring on perimenopause sooner.  What are the signs or symptoms?  Hot flashes.  Night sweats.  Irregular menstrual periods.  Decreased sex drive.  Vaginal dryness.  Headaches.  Mood swings.  Depression.  Memory problems.  Irritability.  Tiredness.  Weight gain.  Trouble getting pregnant.  The beginning of losing bone cells (osteoporosis).  The beginning of hardening of the arteries (atherosclerosis). How is this diagnosed? Your health care provider will make a diagnosis by analyzing your age, menstrual history, and symptoms. He or she will do a physical exam and note any changes in your body, especially your female organs. Female hormone tests may or may not be helpful depending on the amount of female hormones you produce and when you produce them. However, other hormone tests may be helpful to rule out other problems. How is this treated? In some cases, no treatment is  needed. The decision on whether treatment is necessary during the perimenopause should be made by you and your health care provider based on how the symptoms are affecting you and your lifestyle. Various treatments are available, such as:  Treating individual symptoms with a specific medicine for that symptom.  Herbal medicines that can help specific symptoms.  Counseling.  Group therapy.  Follow these instructions at home:  Keep track of your menstrual periods (when they occur, how heavy they are, how long between periods, and how long they last) as well as your symptoms and when they started.  Only take over-the-counter or prescription medicines as directed by your health care provider.  Sleep and rest.  Exercise.  Eat a diet that contains calcium (good for your bones) and soy (acts like the estrogen hormone).  Do not smoke.  Avoid alcoholic beverages.  Take vitamin supplements as recommended by your health care provider. Taking vitamin E may help in certain cases.  Take calcium and vitamin D supplements to help prevent bone loss.  Group therapy is sometimes helpful.  Acupuncture may help in some cases. Contact a health care provider if:  You have questions about any symptoms you are having.  You need a referral to a specialist (gynecologist, psychiatrist, or psychologist). Get help right away if:  You have vaginal bleeding.  Your period lasts longer than 8 days.  Your periods are recurring sooner than 21 days.  You have bleeding after intercourse.  You have severe depression.  You have pain when you urinate.  You have severe headaches.  You have vision problems. This information is not intended to replace advice given to you by your health  care provider. Make sure you discuss any questions you have with your health care provider. Document Released: 09/02/2004 Document Revised: 01/01/2016 Document Reviewed: 02/22/2013 Elsevier Interactive Patient Education   2017 [employer].

## 2018-03-13 LAB — GC/CHLAMYDIA PROBE AMP (~~LOC~~) NOT AT ARMC
Chlamydia: NEGATIVE
NEISSERIA GONORRHEA: NEGATIVE

## 2018-03-14 ENCOUNTER — Telehealth: Payer: Self-pay

## 2018-03-14 NOTE — Telephone Encounter (Signed)
Pt called for test results from MAU Visit on 03/12/18. Advised pt results were Normal and per Maye Hides CNM patient was fine and no sign of infection or bacteria. Pt verbalized understanding, and had no questions.

## 2018-08-27 ENCOUNTER — Other Ambulatory Visit: Payer: Self-pay

## 2018-08-27 ENCOUNTER — Encounter (HOSPITAL_COMMUNITY): Payer: Self-pay | Admitting: *Deleted

## 2018-08-27 ENCOUNTER — Emergency Department (HOSPITAL_COMMUNITY)
Admission: EM | Admit: 2018-08-27 | Discharge: 2018-08-27 | Disposition: A | Discharge status: Home / Self Care | Payer: Medicaid Other | Attending: Emergency Medicine | Admitting: Emergency Medicine

## 2018-08-27 DIAGNOSIS — R220 Localized swelling, mass and lump, head: Secondary | ICD-10-CM | POA: Diagnosis present

## 2018-08-27 DIAGNOSIS — Z79899 Other long term (current) drug therapy: Secondary | ICD-10-CM | POA: Insufficient documentation

## 2018-08-27 DIAGNOSIS — K047 Periapical abscess without sinus: Secondary | ICD-10-CM | POA: Insufficient documentation

## 2018-08-27 DIAGNOSIS — F17210 Nicotine dependence, cigarettes, uncomplicated: Secondary | ICD-10-CM | POA: Diagnosis not present

## 2018-08-27 MED ORDER — IBUPROFEN 800 MG PO TABS: 800.0000 mg | ORAL_TABLET | Freq: Three times a day (TID) | ORAL | 0 refills | Status: AC | PRN

## 2018-08-27 MED ORDER — CLINDAMYCIN HCL 150 MG PO CAPS: 300.0000 mg | ORAL_CAPSULE | Freq: Four times a day (QID) | ORAL | 0 refills | Status: DC

## 2018-08-27 MED ORDER — OXYCODONE-ACETAMINOPHEN 5-325 MG PO TABS
1.0000 | ORAL_TABLET | Freq: Once | ORAL | Status: AC
  Administered 2018-08-27: 1 via ORAL
  Filled 2018-08-27: qty 1

## 2018-08-27 NOTE — Discharge Instructions (Signed)
You have a dental infection. It is very important that you get evaluated by a dentist as soon as possible. Call tomorrow to schedule an appointment. Ibuprofen as needed for pain. Take your full course of antibiotics. Read the instructions below.  Eat a soft or liquid diet and rinse your mouth out after meals with warm water. You should see a dentist or return here at once if you have increased swelling, increased pain or uncontrolled bleeding from the site of your injury.  SEEK MEDICAL CARE IF:  Your swelling gets worse You develop a fever If you are unable to open your mouth New or worsening symptoms develop You have any additional concerns

## 2018-08-27 NOTE — ED Provider Notes (Signed)
[facility] DEPT Provider Note   CSN: [id number] Arrival date & time: 08/27/18  0544     History   Chief Complaint Chief Complaint  Patient presents with  . Facial Swelling    right    HPI Ashley Walker is a 43 y.o. female.  The history is provided by the patient and medical records. No language interpreter was used.    Ashley Walker is a 43 y.o. female who presents to the Emergency Department complaining of right upper dental pain.  Patient states that her pain has been off and on for several months, however yesterday night her pain acutely worsened.  She endorses associated right-sided facial swelling.  She took a Goody powder with no improvement in her pain.  She is waiting for insurance card to come in the mail so that she can establish dental care.  Patient denies fever, chills, difficulty breathing or difficulty swallowing.   Past Medical History:  Diagnosis Date  . Anemia   . Breast mass in female november 2012   two in right breast  . Headache(784.0)   . Heart murmur    diagnosed as a child  . HPV (human papilloma virus) infection   . Seizures ([place])    as a child d/t a fall and this is the only one shes ever had    Patient Active Problem List   Diagnosis Date Noted  . Breast mass, right 06/15/2011    Past Surgical History:  Procedure Laterality Date  . BREAST LUMPECTOMY  07/23/2011   Procedure: LUMPECTOMY;  Surgeon: Harl Bowie, MD;  Location: [facility];  Service: General;  Laterality: Right;  RIGHT BREAST NEEDLE LOCALIZED LUMPECTOMY X 2  . CESAREAN SECTION  2007/1997   2 previous c sections  . fibroid cyst in breast  1991/1991  . INGUINAL HERNIA REPAIR  1999     OB History    Gravida  4   Para  4   Term  3   Preterm  1   AB      Living  4     SAB      TAB      Ectopic      Multiple      Live Births               Home Medications    Prior to Admission medications   Medication  Sig Start Date End Date Taking? Authorizing Provider  albuterol (PROVENTIL HFA;VENTOLIN HFA) 108 (90 BASE) MCG/ACT inhaler Inhale 2 puffs into the lungs every 4 (four) hours as needed for wheezing or shortness of breath. 08/04/14   Britt Bottom, NP  Aspirin-Acetaminophen-Caffeine (GOODY HEADACHE PO) Take 1 Package by mouth daily as needed (headache/pain).    [provider]  ibuprofen (ADVIL,MOTRIN) 800 MG tablet Take 1 tablet (800 mg total) by mouth 3 (three) times daily. 07/08/15   Patel-Mills, Orvil Feil, PA-C    Family History Family History  Problem Relation Age of Onset  . Hypertension Father   . Hypertension Mother   . Anesthesia problems Neg Hx   . Pseudochol deficiency Neg Hx   . Malignant hyperthermia Neg Hx   . Hypotension Neg Hx     Social History Social History   Tobacco Use  . Smoking status: Current Every Day Smoker    Packs/day: 0.25    Types: Cigarettes  . Smokeless tobacco: Never Used  Substance Use Topics  . Alcohol use: No  . Drug use:  No     Allergies   Penicillins   Review of Systems Review of Systems  Constitutional: Negative for chills and fever.  HENT: Positive for dental problem. Negative for trouble swallowing.   Respiratory: Negative for cough and shortness of breath.      Physical Exam Updated Vital Signs BP (!) 172/76 (BP Location: Right Arm)   Pulse 70   Temp 98.4 F (36.9 C) (Oral)   Resp 18   Ht 5\' 2"  (1.575 m)   Wt 99.8 kg   LMP 08/25/2018 (Exact Date)   SpO2 100%   BMI 40.24 kg/m   Physical Exam Vitals signs and nursing note reviewed.  Constitutional:      General: She is not in acute distress.    Appearance: She is well-developed.  HENT:     Head: Normocephalic and atraumatic.     Mouth/Throat:      Comments: Pain along tooth which is broken as depicted in image. + Overlying facial swelling.  No clear abscess noted. Midline uvula. No trismus. OP moist and clear. No oropharyngeal erythema or edema.  No  sublingual tenderness.  Neck supple with no tenderness.  Neck:     Musculoskeletal: Neck supple.  Cardiovascular:     Rate and Rhythm: Normal rate and regular rhythm.     Heart sounds: Normal heart sounds. No murmur.  Pulmonary:     Effort: Pulmonary effort is normal. No respiratory distress.     Breath sounds: Normal breath sounds. No wheezing or rales.  Musculoskeletal: Normal range of motion.  Skin:    General: Skin is warm and dry.  Neurological:     Mental Status: She is alert.      ED Treatments / Results  Labs (all labs ordered are listed, but only abnormal results are displayed) Labs Reviewed - No data to display  EKG None  Radiology No results found.  Procedures Procedures (including critical care time)  Medications Ordered in ED Medications - No data to display   Initial Impression / Assessment and Plan / ED Course  I have reviewed the triage vital signs and the nursing notes.  Pertinent labs & imaging results that were available during my care of the patient were reviewed by me and considered in my medical decision making (see chart for details).    Ashley Walker is a 43 y.o. female who presents to ED for dental pain. No abscess requiring immediate incision and drainage. Patient is afebrile, non toxic appearing, and swallowing secretions well. Exam not concerning for Ludwig's angina or pharyngeal abscess. Will treat with Clindamycin.  She does have fair amount of facial swelling.  Do not believe further imaging or work-up is needed at this point, however did go over strict return precautions with her.  I provided dental resource guide and stressed the importance of dental follow up for ultimate management of dental pain. Patient voices understanding and is agreeable to plan.   Final Clinical Impressions(s) / ED Diagnoses   Final diagnoses:  Dental infection    ED Discharge Orders    None       Auna Mikkelsen, Ozella Almond, PA-C 08/27/18 0710      Mesner, Corene Cornea, MD 08/27/18 0730

## 2018-08-27 NOTE — ED Triage Notes (Signed)
Pt stated "I have a bad right upper tooth and my face started swelling last night."  Pt presents with facial swelling to right cheek.

## 2019-12-27 ENCOUNTER — Emergency Department (HOSPITAL_COMMUNITY)
Admission: EM | Admit: 2019-12-27 | Discharge: 2019-12-27 | Disposition: A | Discharge status: Home / Self Care | Payer: Medicaid Other | Attending: Emergency Medicine | Admitting: Emergency Medicine

## 2019-12-27 ENCOUNTER — Other Ambulatory Visit: Payer: Self-pay

## 2019-12-27 DIAGNOSIS — R6884 Jaw pain: Secondary | ICD-10-CM | POA: Insufficient documentation

## 2019-12-27 DIAGNOSIS — F17210 Nicotine dependence, cigarettes, uncomplicated: Secondary | ICD-10-CM | POA: Diagnosis not present

## 2019-12-27 DIAGNOSIS — Z79899 Other long term (current) drug therapy: Secondary | ICD-10-CM | POA: Insufficient documentation

## 2019-12-27 MED ORDER — CLINDAMYCIN HCL 150 MG PO CAPS: 150.0000 mg | ORAL_CAPSULE | Freq: Four times a day (QID) | ORAL | 0 refills | Status: AC

## 2019-12-27 MED ORDER — HYDROCODONE-ACETAMINOPHEN 5-325 MG PO TABS: 1.0000 | ORAL_TABLET | Freq: Four times a day (QID) | ORAL | 0 refills | Status: AC | PRN

## 2019-12-27 NOTE — Discharge Instructions (Addendum)
Follow up with your dentist.

## 2019-12-27 NOTE — ED Triage Notes (Signed)
Pt reports pain in her R lower gum. States that her wisdom teeth were removed years ago. States that she went to the dentist and they told her that she did not have a cavity.

## 2019-12-27 NOTE — ED Provider Notes (Signed)
Ashley Walker   CSN: [id number] Arrival date & time: 12/27/19  1935     History Chief Complaint  Patient presents with  . Dental Pain    Ashley Walker is a 44 y.o. female.  HPI Patient presents with dental pain.  Began a few days ago.  Right lower jaw.  Behind her last tooth.  States that is where she had her wisdom teeth taken out before.  States she saw her dentist who told her she did not have a cavity.  No trauma.  Worse with chewing.  States she is worried that it could be oral cancer.  No fevers.  No swelling in her mouth.  No difficulty swallowing.    Past Medical History:  Diagnosis Date  . Anemia   . Breast mass in female november 2012   two in right breast  . Headache(784.0)   . Heart murmur    diagnosed as a child  . HPV (human papilloma virus) infection   . Seizures ([place])    as a child d/t a fall and this is the only one shes ever had    Patient Active Problem List   Diagnosis Date Noted  . Breast mass, right 06/15/2011    Past Surgical History:  Procedure Laterality Date  . BREAST LUMPECTOMY  07/23/2011   Procedure: LUMPECTOMY;  Surgeon: Harl Bowie, MD;  Location: [facility];  Service: General;  Laterality: Right;  RIGHT BREAST NEEDLE LOCALIZED LUMPECTOMY X 2  . CESAREAN SECTION  2007/1997   2 previous c sections  . fibroid cyst in breast  1991/1991  . INGUINAL HERNIA REPAIR  1999     OB History    Gravida  4   Para  4   Term  3   Preterm  1   AB      Living  4     SAB      TAB      Ectopic      Multiple      Live Births              Family History  Problem Relation Age of Onset  . Hypertension Father   . Hypertension Mother   . Anesthesia problems Neg Hx   . Pseudochol deficiency Neg Hx   . Malignant hyperthermia Neg Hx   . Hypotension Neg Hx     Social History   Tobacco Use  . Smoking status: Current Every Day Smoker    Packs/day: 0.25    Types:  Cigarettes  . Smokeless tobacco: Never Used  Substance Use Topics  . Alcohol use: No  . Drug use: No    Home Medications Prior to Admission medications   Medication Sig Start Date End Date Taking? Authorizing Provider  albuterol (PROVENTIL HFA;VENTOLIN HFA) 108 (90 BASE) MCG/ACT inhaler Inhale 2 puffs into the lungs every 4 (four) hours as needed for wheezing or shortness of breath. 08/04/14   Britt Bottom, NP  Aspirin-Acetaminophen-Caffeine (GOODY HEADACHE PO) Take 1 Package by mouth daily as needed (headache/pain).    [provider]  clindamycin (CLEOCIN) 150 MG capsule Take 1 capsule (150 mg total) by mouth 4 (four) times daily. 12/27/19   Davonna Belling, MD  HYDROcodone-acetaminophen (NORCO/VICODIN) 5-325 MG tablet Take 1-2 tablets by mouth every 6 (six) hours as needed. 12/27/19   Davonna Belling, MD  ibuprofen (ADVIL,MOTRIN) 800 MG tablet Take 1 tablet (800 mg total) by mouth every 8 (eight) hours as  needed. 08/27/18   Ward, Ozella Almond, PA-C    Allergies    Penicillins  Review of Systems   Review of Systems  Constitutional: Negative for appetite change and fever.  HENT: Positive for dental problem.        Jaw pain.  Respiratory: Negative for shortness of breath.   Cardiovascular: Negative for chest pain.  Gastrointestinal: Negative for abdominal pain.  Neurological: Negative for weakness.    Physical Exam Updated Vital Signs BP (!) 160/88 (BP Location: Left Arm)   Pulse 72   Temp 98 F (36.7 C) (Oral)   Resp 16   SpO2 100%   Physical Exam Vitals and nursing Walker reviewed.  HENT:     Head: Atraumatic.     Mouth/Throat:     Comments: Mild tenderness over the mandible just posterior to the most posterior tooth on the right side.  No fluctuance.  No erythema.  No drainage.  No deformity. Eyes:     Pupils: Pupils are equal, round, and reactive to light.  Musculoskeletal:     Cervical back: Neck supple.  Lymphadenopathy:     Cervical: No  cervical adenopathy.  Skin:    General: Skin is warm.     Capillary Refill: Capillary refill takes less than 2 seconds.  Neurological:     Mental Status: She is alert and oriented to person, place, and time.     ED Results / Procedures / Treatments   Labs (all labs ordered are listed, but only abnormal results are displayed) Labs Reviewed - No data to display  EKG None  Radiology No results found.  Procedures Procedures (including critical care time)  Medications Ordered in ED Medications - No data to display  ED Course  I have reviewed the triage vital signs and the nursing notes.  Pertinent labs & imaging results that were available during my care of the patient were reviewed by me and considered in my medical decision making (see chart for details).    MDM Rules/Calculators/A&P                      Patient presents with jaw pain on the right.  Has had tooth removed previously at this spot.  No abscess seen.  No real swelling but I think likely just sequela of prior tooth removal.  Will give some antibiotics and pain meds.  Will need dental follow-up for further evaluation if pain does not improve.  Oral cancer felt less likely. Final Clinical Impression(s) / ED Diagnoses Final diagnoses:  Jaw pain    Rx / DC Orders ED Discharge Orders         Ordered    clindamycin (CLEOCIN) 150 MG capsule  4 times daily     12/27/19 2149    HYDROcodone-acetaminophen (NORCO/VICODIN) 5-325 MG tablet  Every 6 hours PRN     12/27/19 2149           Davonna Belling, MD 12/27/19 2154

## 2023-06-01 ENCOUNTER — Emergency Department (HOSPITAL_COMMUNITY)
Admission: EM | Admit: 2023-06-01 | Discharge: 2023-06-02 | Disposition: A | Discharge status: Home / Self Care | Payer: Self-pay | Attending: Student | Admitting: Student

## 2023-06-01 ENCOUNTER — Other Ambulatory Visit: Payer: Self-pay

## 2023-06-01 ENCOUNTER — Encounter (HOSPITAL_COMMUNITY): Payer: Self-pay

## 2023-06-01 DIAGNOSIS — B353 Tinea pedis: Secondary | ICD-10-CM | POA: Insufficient documentation

## 2023-06-01 DIAGNOSIS — R519 Headache, unspecified: Secondary | ICD-10-CM | POA: Insufficient documentation

## 2023-06-01 DIAGNOSIS — N644 Mastodynia: Secondary | ICD-10-CM | POA: Insufficient documentation

## 2023-06-01 DIAGNOSIS — R42 Dizziness and giddiness: Secondary | ICD-10-CM | POA: Insufficient documentation

## 2023-06-01 LAB — COMPREHENSIVE METABOLIC PANEL
ALT: 16 U/L (ref 0–44)
AST: 21 U/L (ref 15–41)
Albumin: 4 g/dL (ref 3.5–5.0)
Alkaline Phosphatase: 64 U/L (ref 38–126)
Anion gap: 7 (ref 5–15)
BUN: 7 mg/dL (ref 6–20)
CO2: 23 mmol/L (ref 22–32)
Calcium: 9.7 mg/dL (ref 8.9–10.3)
Chloride: 105 mmol/L (ref 98–111)
Creatinine, Ser: 0.7 mg/dL (ref 0.44–1.00)
GFR, Estimated: >60 mL/min (ref >60)
Glucose, Bld: 115 mg/dL — ABNORMAL HIGH (ref 70–99)
Potassium: 3.7 mmol/L (ref 3.5–5.1)
Sodium: 135 mmol/L (ref 135–145)
Total Bilirubin: 0.6 mg/dL (ref 0.3–1.2)
Total Protein: 7.8 g/dL (ref 6.5–8.1)

## 2023-06-01 LAB — CBC WITH DIFFERENTIAL/PLATELET
Abs Immature Granulocytes: 0.01 10*3/uL (ref 0.00–0.07)
Basophils Absolute: 0 10*3/uL (ref 0.0–0.1)
Basophils Relative: 1 %
Eosinophils Absolute: 0.1 10*3/uL (ref 0.0–0.5)
Eosinophils Relative: 1 %
HCT: 32.7 % — ABNORMAL LOW (ref 36.0–46.0)
Hemoglobin: 9.4 g/dL — ABNORMAL LOW (ref 12.0–15.0)
Immature Granulocytes: 0 %
Lymphocytes Relative: 40 %
Lymphs Abs: 2.2 10*3/uL (ref 0.7–4.0)
MCH: 21.3 pg — ABNORMAL LOW (ref 26.0–34.0)
MCHC: 28.7 g/dL — ABNORMAL LOW (ref 30.0–36.0)
MCV: 74.1 fL — ABNORMAL LOW (ref 80.0–100.0)
Monocytes Absolute: 0.5 10*3/uL (ref 0.1–1.0)
Monocytes Relative: 8 %
Neutro Abs: 2.7 10*3/uL (ref 1.7–7.7)
Neutrophils Relative %: 50 %
Platelets: 348 10*3/uL (ref 150–400)
RBC: 4.41 MIL/uL (ref 3.87–5.11)
RDW: 19.6 % — ABNORMAL HIGH (ref 11.5–15.5)
WBC: 5.4 10*3/uL (ref 4.0–10.5)
nRBC: 0 % (ref 0.0–0.2)

## 2023-06-01 LAB — TROPONIN I (HIGH SENSITIVITY): Troponin I (High Sensitivity): <2 ng/L (ref <18)

## 2023-06-01 LAB — HCG, SERUM, QUALITATIVE: Preg, Serum: NEGATIVE

## 2023-06-01 NOTE — ED Triage Notes (Signed)
Pt states that she had some dizziness and she felt "out of her head" around 6 pm. Pt reports having a light headache and last night she had pain under her right breast.

## 2023-06-01 NOTE — ED Provider Notes (Signed)
[facility] AT [facility] Provider Note   CSN: [id number] Arrival date & time: 06/01/23  1935     History {Add pertinent medical, surgical, social history, OB history to HPI:1} Chief Complaint  Patient presents with   Dizziness    Ashley Walker is a 47 y.o. female.  Patient with past medical history significant for seizures, anemia, right sided breast mass presents to the emergency department complaining of episodes of dizziness, feeling "out of her head" at around 6 PM this evening.  She reports a continued light headache.  She reports that last night she had some pain under her right breast.   Dizziness      Home Medications Prior to Admission medications   Medication Sig Start Date End Date Taking? Authorizing Provider  albuterol (PROVENTIL HFA;VENTOLIN HFA) 108 (90 BASE) MCG/ACT inhaler Inhale 2 puffs into the lungs every 4 (four) hours as needed for wheezing or shortness of breath. 08/04/14   Harle Battiest, NP  Aspirin-Acetaminophen-Caffeine (GOODY HEADACHE PO) Take 1 Package by mouth daily as needed (headache/pain).    [provider]  clindamycin (CLEOCIN) 150 MG capsule Take 1 capsule (150 mg total) by mouth 4 (four) times daily. 12/27/19   Benjiman Core, MD  HYDROcodone-acetaminophen (NORCO/VICODIN) 5-325 MG tablet Take 1-2 tablets by mouth every 6 (six) hours as needed. 12/27/19   Benjiman Core, MD  ibuprofen (ADVIL,MOTRIN) 800 MG tablet Take 1 tablet (800 mg total) by mouth every 8 (eight) hours as needed. 08/27/18   Ward, Chase Picket, PA-C      Allergies    Penicillins    Review of Systems   Review of Systems  Neurological:  Positive for dizziness.    Physical Exam Updated Vital Signs BP (!) 152/89 (BP Location: Left Arm)   Pulse 69   Temp 98 F (36.7 C) (Oral)   Resp 18   Ht 5\' 2"  (1.575 m)   Wt 101.6 kg   SpO2 96%   BMI 40.97 kg/m  Physical Exam  ED Results / Procedures / Treatments    Labs (all labs ordered are listed, but only abnormal results are displayed) Labs Reviewed  CBC WITH DIFFERENTIAL/PLATELET - Abnormal; Notable for the following components:      Result Value   Hemoglobin 9.4 (*)    HCT 32.7 (*)    MCV 74.1 (*)    MCH 21.3 (*)    MCHC 28.7 (*)    RDW 19.6 (*)    All other components within normal limits  COMPREHENSIVE METABOLIC PANEL - Abnormal; Notable for the following components:   Glucose, Bld 115 (*)    All other components within normal limits  HCG, SERUM, QUALITATIVE  TROPONIN I (HIGH SENSITIVITY)  TROPONIN I (HIGH SENSITIVITY)    EKG None  Radiology No results found.  Procedures Procedures  {Document cardiac monitor, telemetry assessment procedure when appropriate:1}  Medications Ordered in ED Medications - No data to display  ED Course/ Medical Decision Making/ A&P   {   Click here for ABCD2, HEART and other calculatorsREFRESH Note before signing :1}                              Medical Decision Making Amount and/or Complexity of Data Reviewed Labs: ordered.   ***  {Document critical care time when appropriate:1} {Document review of labs and clinical decision tools ie heart score, Chads2Vasc2 etc:1}  {Document your independent review of radiology  images, and any outside records:1} {Document your discussion with family members, caretakers, and with consultants:1} {Document social determinants of health affecting pt's care:1} {Document your decision making why or why not admission, treatments were needed:1} Final Clinical Impression(s) / ED Diagnoses Final diagnoses:  None    Rx / DC Orders ED Discharge Orders     None

## 2023-06-02 ENCOUNTER — Emergency Department (HOSPITAL_COMMUNITY): Payer: Self-pay

## 2023-06-02 LAB — TROPONIN I (HIGH SENSITIVITY): Troponin I (High Sensitivity): 2 ng/L (ref <18)

## 2023-06-02 MED ORDER — TERBINAFINE HCL 1 % EX CREA: 1.0000 | TOPICAL_CREAM | Freq: Two times a day (BID) | CUTANEOUS | 0 refills | Status: AC

## 2023-06-02 NOTE — Discharge Instructions (Addendum)
Your workup today was reassuring.  I have prescribed a topical cream for your right foot.  Please apply as directed twice daily for the next 3 to 4 weeks.  I recommend following up with a primary care provider for further evaluation as needed.  If you develop any life-threatening symptoms please return to the emergency department.
